# Patient Record
Sex: Male | Born: 1970
Health system: Southern US, Community
[De-identification: ages and names within clinical notes are randomized; demographics above are authoritative.]

## PROBLEM LIST (undated history)

## (undated) DIAGNOSIS — R358 Other polyuria: Secondary | ICD-10-CM

## (undated) DIAGNOSIS — R350 Frequency of micturition: Secondary | ICD-10-CM

## (undated) DIAGNOSIS — R519 Headache, unspecified: Secondary | ICD-10-CM

## (undated) DIAGNOSIS — F17200 Nicotine dependence, unspecified, uncomplicated: Secondary | ICD-10-CM

## (undated) DIAGNOSIS — R6882 Decreased libido: Secondary | ICD-10-CM

## (undated) DIAGNOSIS — E291 Testicular hypofunction: Secondary | ICD-10-CM

## (undated) DIAGNOSIS — I10 Essential (primary) hypertension: Secondary | ICD-10-CM

## (undated) DIAGNOSIS — R002 Palpitations: Secondary | ICD-10-CM

## (undated) HISTORY — DX: Other polyuria: R35.8

## (undated) HISTORY — DX: Frequency of micturition: R35.0

## (undated) HISTORY — DX: Testicular hypofunction: E29.1

## (undated) HISTORY — DX: Headache, unspecified: R51.9

## (undated) HISTORY — PX: FOOT SURGERY: SHX648

## (undated) HISTORY — DX: Decreased libido: R68.82

## (undated) HISTORY — DX: Nicotine dependence, unspecified, uncomplicated: F17.200

## (undated) HISTORY — DX: Palpitations: R00.2

---

## 2015-02-03 ENCOUNTER — Emergency Department (INDEPENDENT_AMBULATORY_CARE_PROVIDER_SITE_OTHER): Payer: Self-pay

## 2015-02-03 ENCOUNTER — Emergency Department (INDEPENDENT_AMBULATORY_CARE_PROVIDER_SITE_OTHER)
Admission: EM | Admit: 2015-02-03 | Discharge: 2015-02-03 | Disposition: A | Discharge status: Home / Self Care | Payer: Self-pay | Source: Home / Self Care | Attending: Family Medicine | Admitting: Family Medicine

## 2015-02-03 ENCOUNTER — Encounter (HOSPITAL_COMMUNITY): Payer: Self-pay | Admitting: Emergency Medicine

## 2015-02-03 DIAGNOSIS — S9032XA Contusion of left foot, initial encounter: Secondary | ICD-10-CM

## 2015-02-03 DIAGNOSIS — H6122 Impacted cerumen, left ear: Secondary | ICD-10-CM

## 2015-02-03 HISTORY — DX: Essential (primary) hypertension: I10

## 2015-02-03 NOTE — ED Notes (Signed)
Left ear and left foot pain, c/o in the last day

## 2015-02-03 NOTE — ED Provider Notes (Signed)
CSN: [id number]     Arrival date & time 02/03/15  1325 History   First MD Initiated Contact with Patient 02/03/15 1517     Chief Complaint  Patient presents with  . Otalgia   (Consider location/radiation/quality/duration/timing/severity/associated sxs/prior Treatment) HPI Comments: 44 year old male states that he struck the left foot, lateral edge on the door last night. He made contact at the base of the fifth metatarsal. This is where he is tender. He notes he had surgery on the left fifth digit from an infection approximately one year ago. He is ambulatory and bearing weight. His second complaint is decreased hearing in the left ear for approximately 8 months. No pain. States he has a history of wax buildup.   History reviewed. No pertinent past medical history. No past surgical history on file. No family history on file. Social History  Substance Use Topics  . Smoking status: None  . Smokeless tobacco: None  . Alcohol Use: None    Review of Systems  Constitutional: Negative for fever and activity change.  HENT: Negative.  Negative for ear discharge and ear pain.   Respiratory: Negative.   Gastrointestinal: Negative.   Musculoskeletal: Negative for myalgias and back pain.       As per history of present illness  Skin: Negative.   Neurological: Negative.     Allergies  Review of patient's allergies indicates no known allergies.  Home Medications   Prior to Admission medications   Not on File   Meds Ordered and Administered this Visit  Medications - No data to display  BP 151/96 mmHg  Pulse 74  Temp(Src) 98 F (36.7 C) (Oral)  Resp 16  SpO2 98% No data found.   Physical Exam  Constitutional: He is oriented to person, place, and time. He appears well-developed and well-nourished. No distress.  HENT:  Head: Normocephalic and atraumatic.  Left TM with impacted cerumen. Right TM is normal.  Neck: Normal range of motion. Neck supple.  Cardiovascular: Normal  rate.   Pulmonary/Chest: Effort normal. No respiratory distress.  Musculoskeletal: Normal range of motion. He exhibits tenderness. He exhibits no edema.  Left foot with no apparent swelling, deformity or discoloration. There is tenderness to the lateral edge of the distal forefoot over the fifth metatarsal. Skin is intact. Distal neurovascular motor sensory is intact.  Neurological: He is alert and oriented to person, place, and time. He exhibits normal muscle tone.  Skin: Skin is warm and dry.  Psychiatric: He has a normal mood and affect.  Nursing note and vitals reviewed.   ED Course  Procedures (including critical care time)  Labs Review Labs Reviewed - No data to display  Imaging Review Dg Foot Complete Left  02/03/2015   CLINICAL DATA:  44 year old who injured the left 5th toe by striking at on a door. Initial encounter. Prior history of partial resection of the 5th metatarsal due to infection, surgery performed in [place].  EXAM: LEFT FOOT - COMPLETE 3+ VIEW  COMPARISON:  None.  FINDINGS: No evidence of acute fracture or dislocation. Prior resection of the head of the 5th metatarsal. Minimal to mild narrowing of the 1st MCP joint space with dystrophic capsular calcification. Remaining joint spaces well preserved. Bone mineral density well-preserved. Very small plantar calcaneal spur. Phlebolith in the subcutaneous tissues anteriorly at the level of the ankle joint.  IMPRESSION: 1. No acute osseous abnormality. 2. Prior resection of the head of the 5th metatarsal. 3. 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  Electronically Signed   By: Hulan Saas M.D.   On: 02/03/2015 15:43     Visual Acuity Review  Right Eye Distance:   Left Eye Distance:   Bilateral Distance:    Right Eye Near:   Left Eye Near:    Bilateral Near:    Left ear irrigated with warm water til clear.     MDM   1. Contusion, foot, left, initial encounter   2. Cerumen impaction, left      Left EAC was irrigated and cleaned. The EAC is clear now. TM is mildly erythematous.. Tylenol or ibuprofen for pain,  ice off and on, limit weight bearing for 2-3 days.    Hayden Rasmussen, NP 02/03/15 1551  Hayden Rasmussen, NP 02/03/15 [phone]

## 2015-02-03 NOTE — Discharge Instructions (Signed)
Foot Contusion Ice off and on, limit weight bearing for 2-3 days. A foot contusion is a deep bruise to the foot. Contusions are the result of an injury that caused bleeding under the skin. The contusion may turn blue, purple, or yellow. Minor injuries will give you a painless contusion, but more severe contusions may stay painful and swollen for a few weeks. CAUSES  A foot contusion comes from a direct blow to that area, such as a heavy object falling on the foot. SYMPTOMS   Swelling of the foot.  Discoloration of the foot.  Tenderness or soreness of the foot. DIAGNOSIS  You will have a physical exam and will be asked about your history. You may need an X-ray of your foot to look for a broken bone (fracture).  TREATMENT  An elastic wrap may be recommended to support your foot. Resting, elevating, and applying cold compresses to your foot are often the best treatments for a foot contusion. Over-the-counter medicines may also be recommended for pain control. HOME CARE INSTRUCTIONS   Put ice on the injured area.  Put ice in a plastic bag.  Place a towel between your skin and the bag.  Leave the ice on for 15-20 minutes, 03-04 times a day.  Only take over-the-counter or prescription medicines for pain, discomfort, or fever as directed by your caregiver.  If told, use an elastic wrap as directed. This can help reduce swelling. You may remove the wrap for sleeping, showering, and bathing. If your toes become numb, cold, or blue, take the wrap off and reapply it more loosely.  Elevate your foot with pillows to reduce swelling.  Try to avoid standing or walking while the foot is painful. Do not resume use until instructed by your caregiver. Then, begin use gradually. If pain develops, decrease use. Gradually increase activities that do not cause discomfort until you have normal use of your foot.  See your caregiver as directed. It is very important to keep all follow-up appointments in  order to avoid any lasting problems with your foot, including long-term (chronic) pain. SEEK IMMEDIATE MEDICAL CARE IF:   You have increased redness, swelling, or pain in your foot.  Your swelling or pain is not relieved with medicines.  You have loss of feeling in your foot or are unable to move your toes.  Your foot turns cold or blue.  You have pain when you move your toes.  Your foot becomes warm to the touch.  Your contusion does not improve in 2 days. MAKE SURE YOU:   Understand these instructions.  Will watch your condition.  Will get help right away if you are not doing well or get worse. Document Released: 02/08/2006 Document Revised: 10/19/2011 Document Reviewed: 03/23/2011 [facility] Patient Information 2015 [place], [place]. This information is not intended to replace advice given to you by your health care provider. Make sure you discuss any questions you have with your health care provider.  Cerumen Impaction A cerumen impaction is when the wax in your ear forms a plug. This plug usually causes reduced hearing. Sometimes it also causes an earache or dizziness. Removing a cerumen impaction can be difficult and painful. The wax sticks to the ear canal. The canal is sensitive and bleeds easily. If you try to remove a heavy wax buildup with a cotton tipped swab, you may push it in further. Irrigation with water, suction, and small ear curettes may be used to clear out the wax. If the impaction is fixed  to the skin in the ear canal, ear drops may be needed for a few days to loosen the wax. People who build up a lot of wax frequently can use ear wax removal products available in your local drugstore. SEEK MEDICAL CARE IF:  You develop an earache, increased hearing loss, or marked dizziness. Document Released: 05/27/2004 Document Revised: 07/12/2011 Document Reviewed: 07/17/2009 [facility] Patient Information 2015 [place], [place]. This information is not intended to replace advice  given to you by your health care provider. Make sure you discuss any questions you have with your health care provider.

## 2015-02-17 ENCOUNTER — Other Ambulatory Visit (HOSPITAL_COMMUNITY): Payer: Self-pay | Admitting: Orthopaedic Surgery

## 2015-02-17 DIAGNOSIS — M79672 Pain in left foot: Secondary | ICD-10-CM

## 2015-02-25 ENCOUNTER — Ambulatory Visit (HOSPITAL_COMMUNITY)
Admission: RE | Admit: 2015-02-25 | Discharge: 2015-02-25 | Disposition: A | Discharge status: Home / Self Care | Payer: Self-pay | Source: Ambulatory Visit | Attending: Orthopaedic Surgery | Admitting: Orthopaedic Surgery

## 2015-02-25 ENCOUNTER — Other Ambulatory Visit (HOSPITAL_COMMUNITY): Payer: Self-pay | Admitting: Orthopaedic Surgery

## 2015-02-25 DIAGNOSIS — M79672 Pain in left foot: Secondary | ICD-10-CM

## 2015-02-25 DIAGNOSIS — Z8739 Personal history of other diseases of the musculoskeletal system and connective tissue: Secondary | ICD-10-CM | POA: Insufficient documentation

## 2015-02-25 DIAGNOSIS — M19072 Primary osteoarthritis, left ankle and foot: Secondary | ICD-10-CM | POA: Insufficient documentation

## 2015-02-25 LAB — POCT I-STAT CREATININE: Creatinine, Ser: 1.1 mg/dL (ref 0.61–1.24)

## 2015-02-25 MED ORDER — GADOBENATE DIMEGLUMINE 529 MG/ML IV SOLN
20.0000 mL | Freq: Once | INTRAVENOUS | Status: AC | PRN
  Administered 2015-02-25: 19 mL via INTRAVENOUS

## 2015-02-27 ENCOUNTER — Encounter: Payer: Self-pay | Admitting: Family Medicine

## 2015-02-27 ENCOUNTER — Ambulatory Visit (INDEPENDENT_AMBULATORY_CARE_PROVIDER_SITE_OTHER): Payer: Self-pay | Admitting: Family Medicine

## 2015-02-27 VITALS — BP 164/98 | HR 66 | Temp 98.6°F | Resp 16 | Ht 72.0 in | Wt 209.0 lb

## 2015-02-27 DIAGNOSIS — R358 Other polyuria: Secondary | ICD-10-CM

## 2015-02-27 DIAGNOSIS — Z0000 Encounter for general adult medical examination without abnormal findings: Secondary | ICD-10-CM

## 2015-02-27 DIAGNOSIS — I10 Essential (primary) hypertension: Secondary | ICD-10-CM

## 2015-02-27 DIAGNOSIS — R350 Frequency of micturition: Secondary | ICD-10-CM

## 2015-02-27 DIAGNOSIS — R3589 Other polyuria: Secondary | ICD-10-CM

## 2015-02-27 DIAGNOSIS — R5383 Other fatigue: Secondary | ICD-10-CM

## 2015-02-27 DIAGNOSIS — F17200 Nicotine dependence, unspecified, uncomplicated: Secondary | ICD-10-CM

## 2015-02-27 HISTORY — DX: Frequency of micturition: R35.0

## 2015-02-27 HISTORY — DX: Other polyuria: R35.89

## 2015-02-27 LAB — COMPLETE METABOLIC PANEL WITH GFR
ALT: 15 U/L (ref 9–46)
AST: 19 U/L (ref 10–40)
Albumin: 4.3 g/dL (ref 3.6–5.1)
Alkaline Phosphatase: 78 U/L (ref 40–115)
BUN: 9 mg/dL (ref 7–25)
CO2: 27 mmol/L (ref 20–31)
Calcium: 9.2 mg/dL (ref 8.6–10.3)
Chloride: 102 mmol/L (ref 98–110)
Creat: 0.93 mg/dL (ref 0.60–1.35)
GFR, Est African American: >89 mL/min (ref >=60)
GFR, Est Non African American: >89 mL/min (ref >=60)
Glucose, Bld: 86 mg/dL (ref 65–99)
Potassium: 4.3 mmol/L (ref 3.5–5.3)
Sodium: 137 mmol/L (ref 135–146)
Total Bilirubin: 0.9 mg/dL (ref 0.2–1.2)
Total Protein: 6.6 g/dL (ref 6.1–8.1)

## 2015-02-27 LAB — LIPID PANEL
CHOLESTEROL: 149 mg/dL (ref 125–200)
HDL: 75 mg/dL (ref >=40)
LDL CALC: 56 mg/dL (ref <130)
TRIGLYCERIDES: 89 mg/dL (ref <150)
Total CHOL/HDL Ratio: 2 Ratio (ref <=5.0)
VLDL: 18 mg/dL (ref <30)

## 2015-02-27 LAB — CBC WITH DIFFERENTIAL/PLATELET
Basophils Absolute: 0.1 10*3/uL (ref 0.0–0.1)
Basophils Relative: 1 % (ref 0–1)
Eosinophils Absolute: 0.1 10*3/uL (ref 0.0–0.7)
Eosinophils Relative: 1 % (ref 0–5)
HCT: 40.2 % (ref 39.0–52.0)
Hemoglobin: 13.6 g/dL (ref 13.0–17.0)
Lymphocytes Relative: 25 % (ref 12–46)
Lymphs Abs: 2 10*3/uL (ref 0.7–4.0)
MCH: 31.9 pg (ref 26.0–34.0)
MCHC: 33.8 g/dL (ref 30.0–36.0)
MCV: 94.4 fL (ref 78.0–100.0)
MPV: 10.4 fL (ref 8.6–12.4)
Monocytes Absolute: 0.4 10*3/uL (ref 0.1–1.0)
Monocytes Relative: 5 % (ref 3–12)
Neutro Abs: 5.3 10*3/uL (ref 1.7–7.7)
Neutrophils Relative %: 68 % (ref 43–77)
Platelets: 220 10*3/uL (ref 150–400)
RBC: 4.26 MIL/uL (ref 4.22–5.81)
RDW: 12.8 % (ref 11.5–15.5)
WBC: 7.8 10*3/uL (ref 4.0–10.5)

## 2015-02-27 LAB — POCT URINALYSIS DIP (DEVICE)
Bilirubin Urine: NEGATIVE
Glucose, UA: NEGATIVE mg/dL
HGB URINE DIPSTICK: NEGATIVE
KETONES UR: NEGATIVE mg/dL
Leukocytes, UA: NEGATIVE
NITRITE: NEGATIVE
PH: 7.5 (ref 5.0–8.0)
Protein, ur: NEGATIVE mg/dL
Specific Gravity, Urine: 1.02 (ref 1.005–1.030)
UROBILINOGEN UA: 0.2 mg/dL (ref 0.0–1.0)

## 2015-02-27 LAB — HEMOGLOBIN A1C
HEMOGLOBIN A1C: 5 % (ref <5.7)
MEAN PLASMA GLUCOSE: 97 mg/dL (ref <117)

## 2015-02-27 LAB — TSH: TSH: 1.048 u[IU]/mL (ref 0.350–4.500)

## 2015-02-27 MED ORDER — AMLODIPINE BESYLATE 5 MG PO TABS: 5.0000 mg | ORAL_TABLET | Freq: Every day | ORAL | Status: DC

## 2015-02-27 NOTE — Progress Notes (Signed)
Subjective:    Patient ID: Joshua Blake., male    DOB: June 05, 1970, 44 y.o.   MRN: [id number]  HPI Mr. Tamel Abel, a 44 year old male presents accompanied by wife to establish care. Mr. Cullipher states that he relocated from [place] 6 months ago and has not had a primary provider. He maintains that he has been using urgent care for all of his primary needs. He states that he has a history of hypertension and was on Lisinopril several years ago. He currently does not exercise or follow a low sodium diet. He is a heavy tobacco user, he smokes 1 pack per day. Patient denies chest pain, dyspnea, fatigue, lower extremity edema, orthopnea, syncope and tachypnea.  Cardiovascular risk factors include: smoking/ tobacco exposure. Past Medical History  Diagnosis Date  . Hypertension    Social History   Social History  . Marital Status: Single    Spouse Name: N/A  . Number of Children: N/A  . Years of Education: N/A   Occupational History  . Not on file.   Social History Main Topics  . Smoking status: Current Every Day Smoker -- 1.00 packs/day  . Smokeless tobacco: Not on file  . Alcohol Use: Yes     Comment: SOCIALLY   . Drug Use: No  . Sexual Activity: Not on file   Other Topics Concern  . Not on file   Social History Narrative   No Known Allergies   Review of Systems  Constitutional: Positive for fatigue.  HENT: Negative.   Eyes: Negative.   Cardiovascular: Negative.   Gastrointestinal: Negative.   Endocrine: Positive for polyuria. Negative for polydipsia and polyphagia.  Genitourinary: Positive for frequency. Negative for urgency and decreased urine volume.  Musculoskeletal: Negative.  Negative for myalgias.  Skin: Negative.   Allergic/Immunologic: Negative.   Neurological: Negative.   Hematological: Negative.   Psychiatric/Behavioral: Negative.  Negative for suicidal ideas and sleep disturbance.      Objective:   Physical Exam  Constitutional: He is  oriented to person, place, and time. He appears well-developed and well-nourished.  HENT:  Head: Normocephalic and atraumatic.  Right Ear: External ear normal.  Left Ear: External ear normal.  Mouth/Throat: Oropharynx is clear and moist. Abnormal dentition. Dental caries present.  Eyes: Conjunctivae and EOM are normal. Pupils are equal, round, and reactive to light.  Neck: Normal range of motion. Neck supple. No thyromegaly present.  Cardiovascular: Normal rate, regular rhythm, normal heart sounds and intact distal pulses.   Pulmonary/Chest: Effort normal and breath sounds normal.  Abdominal: Soft. Bowel sounds are normal. He exhibits no distension. There is no tenderness.  Musculoskeletal: Normal range of motion. He exhibits no edema or tenderness.  Neurological: He is alert and oriented to person, place, and time. He displays normal reflexes. He exhibits normal muscle tone.  Skin: Skin is warm and dry.  Psychiatric: He has a normal mood and affect. His behavior is normal. Judgment and thought content normal.      BP 164/98 mmHg  Pulse 66  Temp(Src) 98.6 F (37 C) (Oral)  Resp 16  Ht 6' (1.829 m)  Wt 209 lb (94.802 kg)  BMI 28.34 kg/m2  SpO2 100%  Assessment & Plan:   1. Essential hypertension Blood pressure is currently above target. Will start amlodipine 5 mg daily. Discussed potential side effects at length. Reviewed urinalysis, no proteinuria present.  - POCT urinalysis dipstick - amLODipine (NORVASC) 5 MG tablet; Take 1 tablet (5 mg total)  by mouth daily.  Dispense: 30 tablet; Refill: 0 - Lipid Panel  2. Frequency of urination and polyuria - Hemoglobin A1C - amLODipine (NORVASC) 5 MG tablet; Take 1 tablet (5 mg total) by mouth daily.  Dispense: 30 tablet; Refill: 0  3. Other fatigue - COMPLETE METABOLIC PANEL WITH GFR - CBC with Differential - TSH  4. Tobacco dependence Smoking cessation instruction/counseling given:  counseled patient on the dangers of tobacco use,  advised patient to stop smoking, and reviewed strategies to maximize success  5. Routine health maintenance Will schedule CPE with digital rectal examination Recommend yearly eye examination Recommend dental visit for multiple caries Recommend a lowfat, low carbohydrate diet divided over 5-6 small meals, increase water intake to 6-8 glasses, and 150 minutes per week of cardiovascular exercise.   RTC: 1 month for hypertension Marda Breidenbach M, FNP    The patient was given clear instructions to go to ER or return to medical center if symptoms do not improve, worsen or new problems develop. The patient verbalized understanding. Will notify patient with laboratory results.

## 2015-02-27 NOTE — Patient Instructions (Addendum)
Will start Amlodipine 5 mg daily.il follow up in 1 month.  Recommend a lowfat, low carbohydrate diet divided over 5-6 small meals, increase water intake to 6-8 glasses, and 150 minutes per week of cardiovascular exercise.  Hypertension Hypertension, commonly called high blood pressure, is when the force of blood pumping through your arteries is too strong. Your arteries are the blood vessels that carry blood from your heart throughout your body. A blood pressure reading consists of a higher number over a lower number, such as 110/72. The higher number (systolic) is the pressure inside your arteries when your heart pumps. The lower number (diastolic) is the pressure inside your arteries when your heart relaxes. Ideally you want your blood pressure below 120/80. Hypertension forces your heart to work harder to pump blood. Your arteries may become narrow or stiff. Having untreated or uncontrolled hypertension can cause heart attack, stroke, kidney disease, and other problems. RISK FACTORS Some risk factors for high blood pressure are controllable. Others are not.  Risk factors you cannot control include:   Race. You may be at higher risk if you are African American.  Age. Risk increases with age.  Gender. Men are at higher risk than women before age 43 years. After age 43, women are at higher risk than men. Risk factors you can control include:  Not getting enough exercise or physical activity.  Being overweight.  Getting too much fat, sugar, calories, or salt in your diet.  Drinking too much alcohol. SIGNS AND SYMPTOMS Hypertension does not usually cause signs or symptoms. Extremely high blood pressure (hypertensive crisis) may cause headache, anxiety, shortness of breath, and nosebleed. DIAGNOSIS To check if you have hypertension, your health care provider will measure your blood pressure while you are seated, with your arm held at the level of your heart. It should be measured at least  twice using the same arm. Certain conditions can cause a difference in blood pressure between your right and left arms. A blood pressure reading that is higher than normal on one occasion does not mean that you need treatment. If it is not clear whether you have high blood pressure, you may be asked to return on a different day to have your blood pressure checked again. Or, you may be asked to monitor your blood pressure at home for 1 or more weeks. TREATMENT Treating high blood pressure includes making lifestyle changes and possibly taking medicine. Living a healthy lifestyle can help lower high blood pressure. You may need to change some of your habits. Lifestyle changes may include:  Following the DASH diet. This diet is high in fruits, vegetables, and whole grains. It is low in salt, red meat, and added sugars.  Keep your sodium intake below 2,300 mg per day.  Getting at least 30-45 minutes of aerobic exercise at least 4 times per week.  Losing weight if necessary.  Not smoking.  Limiting alcoholic beverages.  Learning ways to reduce stress. Your health care provider may prescribe medicine if lifestyle changes are not enough to get your blood pressure under control, and if one of the following is true:  You are 5218-43 years of age and your systolic blood pressure is above 140.  You are 43 years of age or older, and your systolic blood pressure is above 150.  Your diastolic blood pressure is above 90.  You have diabetes, and your systolic blood pressure is over 140 or your diastolic blood pressure is over 90.  You have kidney disease and  your blood pressure is above 140/90.  You have heart disease and your blood pressure is above 140/90. Your personal target blood pressure may vary depending on your medical conditions, your age, and other factors. HOME CARE INSTRUCTIONS  Have your blood pressure rechecked as directed by your health care provider.   Take medicines only as  directed by your health care provider. Follow the directions carefully. Blood pressure medicines must be taken as prescribed. The medicine does not work as well when you skip doses. Skipping doses also puts you at risk for problems.  Do not smoke.   Monitor your blood pressure at home as directed by your health care provider. SEEK MEDICAL CARE IF:   You think you are having a reaction to medicines taken.  You have recurrent headaches or feel dizzy.  You have swelling in your ankles.  You have trouble with your vision. SEEK IMMEDIATE MEDICAL CARE IF:  You develop a severe headache or confusion.  You have unusual weakness, numbness, or feel faint.  You have severe chest or abdominal pain.  You vomit repeatedly.  You have trouble breathing. MAKE SURE YOU:   Understand these instructions.  Will watch your condition.  Will get help right away if you are not doing well or get worse.   This information is not intended to replace advice given to you by your health care provider. Make sure you discuss any questions you have with your health care provider.   Document Released: 04/19/2005 Document Revised: 09/03/2014 Document Reviewed: 02/09/2013 Elsevier Interactive Patient Education 2016 [employer] DASH Eating Plan DASH stands for "Dietary Approaches to Stop Hypertension." The DASH eating plan is a healthy eating plan that has been shown to reduce high blood pressure (hypertension). Additional health benefits may include reducing the risk of type 2 diabetes mellitus, heart disease, and stroke. The DASH eating plan may also help with weight loss. WHAT DO I NEED TO KNOW ABOUT THE DASH EATING PLAN? For the DASH eating plan, you will follow these general guidelines:  Choose foods with a percent daily value for sodium of less than 5% (as listed on the food label).  Use salt-free seasonings or herbs instead of table salt or sea salt.  Check with your health care provider or  pharmacist before using salt substitutes.  Eat lower-sodium products, often labeled as "lower sodium" or "no salt added."  Eat fresh foods.  Eat more vegetables, fruits, and low-fat dairy products.  Choose whole grains. Look for the word "whole" as the first word in the ingredient list.  Choose fish and skinless chicken or [place] more often than red meat. Limit fish, poultry, and meat to 6 oz (170 g) each day.  Limit sweets, desserts, sugars, and sugary drinks.  Choose heart-healthy fats.  Limit cheese to 1 oz (28 g) per day.  Eat more home-cooked food and less restaurant, buffet, and fast food.  Limit fried foods.  Cook foods using methods other than frying.  Limit canned vegetables. If you do use them, rinse them well to decrease the sodium.  When eating at a restaurant, ask that your food be prepared with less salt, or no salt if possible. WHAT FOODS CAN I EAT? Seek help from a dietitian for individual calorie needs. Grains Whole grain or whole wheat bread. Brown rice. Whole grain or whole wheat pasta. Quinoa, bulgur, and whole grain cereals. Low-sodium cereals. Corn or whole wheat flour tortillas. Whole grain cornbread. Whole grain crackers. Low-sodium crackers. Vegetables Fresh or frozen  vegetables (raw, steamed, roasted, or grilled). Low-sodium or reduced-sodium tomato and vegetable juices. Low-sodium or reduced-sodium tomato sauce and paste. Low-sodium or reduced-sodium canned vegetables.  Fruits All fresh, canned (in natural juice), or frozen fruits. Meat and Other Protein Products Ground beef (85% or leaner), grass-fed beef, or beef trimmed of fat. Skinless chicken or [place]. Ground chicken or [place]. Pork trimmed of fat. All fish and seafood. Eggs. Dried beans, peas, or lentils. Unsalted nuts and seeds. Unsalted canned beans. Dairy Low-fat dairy products, such as skim or 1% milk, 2% or reduced-fat cheeses, low-fat ricotta or cottage cheese, or plain low-fat yogurt.  Low-sodium or reduced-sodium cheeses. Fats and Oils Tub margarines without trans fats. Light or reduced-fat mayonnaise and salad dressings (reduced sodium). Avocado. Safflower, olive, or canola oils. Natural peanut or almond butter. Other Unsalted popcorn and pretzels. The items listed above may not be a complete list of recommended foods or beverages. Contact your dietitian for more options. WHAT FOODS ARE NOT RECOMMENDED? Grains White bread. White pasta. White rice. Refined cornbread. Bagels and croissants. Crackers that contain trans fat. Vegetables Creamed or fried vegetables. Vegetables in a cheese sauce. Regular canned vegetables. Regular canned tomato sauce and paste. Regular tomato and vegetable juices. Fruits Dried fruits. Canned fruit in light or heavy syrup. Fruit juice. Meat and Other Protein Products Fatty cuts of meat. Ribs, chicken wings, bacon, sausage, bologna, salami, chitterlings, fatback, hot dogs, bratwurst, and packaged luncheon meats. Salted nuts and seeds. Canned beans with salt. Dairy Whole or 2% milk, cream, half-and-half, and cream cheese. Whole-fat or sweetened yogurt. Full-fat cheeses or blue cheese. Nondairy creamers and whipped toppings. Processed cheese, cheese spreads, or cheese curds. Condiments Onion and garlic salt, seasoned salt, table salt, and sea salt. Canned and packaged gravies. Worcestershire sauce. Tartar sauce. Barbecue sauce. Teriyaki sauce. Soy sauce, including reduced sodium. Steak sauce. Fish sauce. Oyster sauce. Cocktail sauce. Horseradish. Ketchup and mustard. Meat flavorings and tenderizers. Bouillon cubes. Hot sauce. Tabasco sauce. Marinades. Taco seasonings. Relishes. Fats and Oils Butter, stick margarine, lard, shortening, ghee, and bacon fat. Coconut, palm kernel, or palm oils. Regular salad dressings. Other Pickles and olives. Salted popcorn and pretzels. The items listed above may not be a complete list of foods and beverages to avoid.  Contact your dietitian for more information. WHERE CAN I FIND MORE INFORMATION? National Heart, Lung, and Blood Institute: [employer]   This information is not intended to replace advice given to you by your health care provider. Make sure you discuss any questions you have with your health care provider.   Document Released: 04/08/2011 Document Revised: 05/10/2014 Document Reviewed: 02/21/2013 Elsevier Interactive Patient Education [employer]

## 2015-03-19 ENCOUNTER — Telehealth: Payer: Self-pay | Admitting: Family Medicine

## 2015-03-19 NOTE — Telephone Encounter (Signed)
Left message for patient to call regarding rescheduled appointment for 04/01/15.

## 2015-04-01 ENCOUNTER — Ambulatory Visit: Payer: Self-pay | Admitting: Family Medicine

## 2015-04-03 ENCOUNTER — Encounter: Payer: Self-pay | Admitting: Family Medicine

## 2015-04-03 ENCOUNTER — Ambulatory Visit (INDEPENDENT_AMBULATORY_CARE_PROVIDER_SITE_OTHER): Payer: Self-pay | Admitting: Family Medicine

## 2015-04-03 VITALS — BP 142/96 | HR 65 | Temp 98.2°F | Resp 14 | Ht 72.0 in | Wt 213.0 lb

## 2015-04-03 DIAGNOSIS — G4730 Sleep apnea, unspecified: Secondary | ICD-10-CM

## 2015-04-03 DIAGNOSIS — Z87898 Personal history of other specified conditions: Secondary | ICD-10-CM

## 2015-04-03 DIAGNOSIS — R350 Frequency of micturition: Secondary | ICD-10-CM

## 2015-04-03 DIAGNOSIS — I10 Essential (primary) hypertension: Secondary | ICD-10-CM

## 2015-04-03 DIAGNOSIS — G4710 Hypersomnia, unspecified: Secondary | ICD-10-CM

## 2015-04-03 DIAGNOSIS — R358 Other polyuria: Secondary | ICD-10-CM

## 2015-04-03 DIAGNOSIS — R3589 Other polyuria: Secondary | ICD-10-CM

## 2015-04-03 DIAGNOSIS — F17200 Nicotine dependence, unspecified, uncomplicated: Secondary | ICD-10-CM

## 2015-04-03 DIAGNOSIS — Z8489 Family history of other specified conditions: Secondary | ICD-10-CM

## 2015-04-03 HISTORY — DX: Nicotine dependence, unspecified, uncomplicated: F17.200

## 2015-04-03 LAB — POCT URINALYSIS DIP (DEVICE)
BILIRUBIN URINE: NEGATIVE
Glucose, UA: NEGATIVE mg/dL
HGB URINE DIPSTICK: NEGATIVE
KETONES UR: NEGATIVE mg/dL
Leukocytes, UA: NEGATIVE
Nitrite: NEGATIVE
PH: 5.5 (ref 5.0–8.0)
PROTEIN: NEGATIVE mg/dL
Urobilinogen, UA: 0.2 mg/dL (ref 0.0–1.0)

## 2015-04-03 MED ORDER — AMLODIPINE BESYLATE 5 MG PO TABS: 5.0000 mg | ORAL_TABLET | Freq: Every day | ORAL | Status: DC

## 2015-04-03 NOTE — Progress Notes (Signed)
Subjective:    Patient ID: Joshua Blake., male    DOB: 03-31-1971, 44 y.o.   MRN: [id number]  Hypertension   Mr. Joshua Blake, a 44 year old male presents accompanied by wife for a 1 month follow-up.He currently does not exercise or follow a low sodium diet. He is a heavy tobacco user, he smokes 1 pack per day. Patient denies chest pain, dyspnea, fatigue, lower extremity edema, orthopnea, syncope and tachypnea.  Cardiovascular risk factors include: smoking/ tobacco exposure. Patient is complaining of increased fatigue. He states that he gets 5-6 hours of sleep per night. He works a non traditional schedule and typically gets home at 2 am. His wife states that he snores heavily and has periods of not breathing. Patient states that excessive daytime sleepiness has been occuring over the past month.    Past Medical History  Diagnosis Date  . Hypertension    Social History   Social History  . Marital Status: Single    Spouse Name: N/A  . Number of Children: N/A  . Years of Education: N/A   Occupational History  . Not on file.   Social History Main Topics  . Smoking status: Current Every Day Smoker -- 1.00 packs/day  . Smokeless tobacco: Not on file  . Alcohol Use: Yes     Comment: SOCIALLY   . Drug Use: No  . Sexual Activity: Not on file   Other Topics Concern  . Not on file   Social History Narrative   No Known Allergies   Review of Systems  Constitutional: Positive for fatigue (excessive daytime sleepiness).  HENT: Negative.   Eyes: Negative.  Negative for visual disturbance.  Respiratory: Positive for apnea (observed sleep apnea).   Cardiovascular: Negative.   Gastrointestinal: Negative.   Endocrine: Negative for polydipsia, polyphagia and polyuria.  Genitourinary: Negative for urgency and decreased urine volume.  Musculoskeletal: Negative.  Negative for myalgias.  Skin: Negative.   Allergic/Immunologic: Negative.   Neurological: Negative.  Negative  for dizziness and facial asymmetry.  Hematological: Negative.   Psychiatric/Behavioral: Negative.  Negative for suicidal ideas and sleep disturbance.      Objective:   Physical Exam  Constitutional: He is oriented to person, place, and time. He appears well-developed and well-nourished.  HENT:  Head: Normocephalic and atraumatic.  Right Ear: External ear normal.  Left Ear: External ear normal.  Mouth/Throat: Oropharynx is clear and moist.  Eyes: Conjunctivae and EOM are normal. Pupils are equal, round, and reactive to light.  Neck: Normal range of motion. Neck supple. No thyromegaly present.  Cardiovascular: Normal rate, regular rhythm, normal heart sounds and intact distal pulses.   Pulmonary/Chest: Effort normal and breath sounds normal.  Abdominal: Soft. Bowel sounds are normal. He exhibits no distension. There is no tenderness.  Musculoskeletal: Normal range of motion. He exhibits no edema or tenderness.  Neurological: He is alert and oriented to person, place, and time. He displays normal reflexes. He exhibits normal muscle tone.  Skin: Skin is warm and dry.  Psychiatric: He has a normal mood and affect. His behavior is normal. Judgment and thought content normal.      BP 142/96 mmHg  Pulse 65  Temp(Src) 98.2 F (36.8 C) (Oral)  Resp 14  Ht 6' (1.829 Blake)  Wt 213 lb (96.616 kg)  BMI 28.88 kg/m2  Assessment & Plan:   1. Essential hypertension Blood pressure is at goal on current medication regimen. The patient is asked to make an attempt to  improve diet and exercise patterns to aid in medical management of this problem. Will continue amlodipine. Reviewed urinalysis, no proteinuria present.  - POCT urinalysis dipstick  2. Excessive sleepiness Patient typically gets 5-6 hours of sleep during the night, he awakens once to urinate. He reports that he does not have difficulty falling asleep or staying asleep.  - Split night study; Future  3. Observed sleep apnea  - Split  night study; Future  4. History of snoring  - Split night study; Future  5. Tobacco dependence Smoking cessation instruction/counseling given:  counseled patient on the dangers of tobacco use, advised patient to stop smoking, and reviewed strategies to maximize success     RTC: 3 months for hypertension   Massie MaroonHollis,Joshua Ciresi M, FNP

## 2015-04-03 NOTE — Patient Instructions (Addendum)
Hypertension Hypertension, commonly called high blood pressure, is when the force of blood pumping through your arteries is too strong. Your arteries are the blood vessels that carry blood from your heart throughout your body. A blood pressure reading consists of a higher number over a lower number, such as 110/72. The higher number (systolic) is the pressure inside your arteries when your heart pumps. The lower number (diastolic) is the pressure inside your arteries when your heart relaxes. Ideally you want your blood pressure below 120/80. Hypertension forces your heart to work harder to pump blood. Your arteries may become narrow or stiff. Having untreated or uncontrolled hypertension can cause heart attack, stroke, kidney disease, and other problems. RISK FACTORS Some risk factors for high blood pressure are controllable. Others are not.  Risk factors you cannot control include:   Race. You may be at higher risk if you are African American.  Age. Risk increases with age.  Gender. Men are at higher risk than women before age 88 years. After age 105, women are at higher risk than men. Risk factors you can control include: 1. Not getting enough exercise or physical activity. 2. Being overweight. 3. Getting too much fat, sugar, calories, or salt in your diet. 4. Drinking too much alcohol. SIGNS AND SYMPTOMS Hypertension does not usually cause signs or symptoms. Extremely high blood pressure (hypertensive crisis) may cause headache, anxiety, shortness of breath, and nosebleed. DIAGNOSIS To check if you have hypertension, your health care provider will measure your blood pressure while you are seated, with your arm held at the level of your heart. It should be measured at least twice using the same arm. Certain conditions can cause a difference in blood pressure between your right and left arms. A blood pressure reading that is higher than normal on one occasion does not mean that you need  treatment. If it is not clear whether you have high blood pressure, you may be asked to return on a different day to have your blood pressure checked again. Or, you may be asked to monitor your blood pressure at home for 1 or more weeks. TREATMENT Treating high blood pressure includes making lifestyle changes and possibly taking medicine. Living a healthy lifestyle can help lower high blood pressure. You may need to change some of your habits. Lifestyle changes may include:  Following the DASH diet. This diet is high in fruits, vegetables, and whole grains. It is low in salt, red meat, and added sugars.  Keep your sodium intake below 2,300 mg per day.  Getting at least 30-45 minutes of aerobic exercise at least 4 times per week.  Losing weight if necessary.  Not smoking.  Limiting alcoholic beverages.  Learning ways to reduce stress. Your health care provider may prescribe medicine if lifestyle changes are not enough to get your blood pressure under control, and if one of the following is true:  You are 41-58 years of age and your systolic blood pressure is above 140.  You are 46 years of age or older, and your systolic blood pressure is above 150.  Your diastolic blood pressure is above 90.  You have diabetes, and your systolic blood pressure is over [id number] or your diastolic blood pressure is over 90.  You have kidney disease and your blood pressure is above 140/90.  You have heart disease and your blood pressure is above 140/90. Your personal target blood pressure may vary depending on your medical conditions, your age, and other factors. HOME CARE INSTRUCTIONS  Have your blood pressure rechecked as directed by your health care provider.   Take medicines only as directed by your health care provider. Follow the directions carefully. Blood pressure medicines must be taken as prescribed. The medicine does not work as well when you skip doses. Skipping doses also puts you at risk for  problems.  Do not smoke.   Monitor your blood pressure at home as directed by your health care provider. SEEK MEDICAL CARE IF:   You think you are having a reaction to medicines taken.  You have recurrent headaches or feel dizzy.  You have swelling in your ankles.  You have trouble with your vision. SEEK IMMEDIATE MEDICAL CARE IF:  You develop a severe headache or confusion.  You have unusual weakness, numbness, or feel faint.  You have severe chest or abdominal pain.  You vomit repeatedly.  You have trouble breathing. MAKE SURE YOU:   Understand these instructions.  Will watch your condition.  Will get help right away if you are not doing well or get worse.   This information is not intended to replace advice given to you by your health care provider. Make sure you discuss any questions you have with your health care provider.   Document Released: 04/19/2005 Document Revised: 09/03/2014 Document Reviewed: 02/09/2013 Elsevier Interactive Patient Education 2016 Elsevier Inc. Sleep Apnea  Sleep apnea is a sleep disorder characterized by abnormal pauses in breathing while you sleep. When your breathing pauses, the level of oxygen in your blood decreases. This causes you to move out of deep sleep and into light sleep. As a result, your quality of sleep is poor, and the system that carries your blood throughout your body (cardiovascular system) experiences stress. If sleep apnea remains untreated, the following conditions can develop:  High blood pressure (hypertension).  Coronary artery disease.  Inability to achieve or maintain an erection (impotence).  Impairment of your thought process (cognitive dysfunction). There are three types of sleep apnea: 5. Obstructive sleep apnea--Pauses in breathing during sleep because of a blocked airway. 6. Central sleep apnea--Pauses in breathing during sleep because the area of the brain that controls your breathing does not send  the correct signals to the muscles that control breathing. 7. Mixed sleep apnea--A combination of both obstructive and central sleep apnea. RISK FACTORS The following risk factors can increase your risk of developing sleep apnea:  Being overweight.  Smoking.  Having narrow passages in your nose and throat.  Being of older age.  Being male.  Alcohol use.  Sedative and tranquilizer use.  Ethnicity. Among individuals younger than 35 years, African Americans are at increased risk of sleep apnea. SYMPTOMS   Difficulty staying asleep.  Daytime sleepiness and fatigue.  Loss of energy.  Irritability.  Loud, heavy snoring.  Morning headaches.  Trouble concentrating.  Forgetfulness.  Decreased interest in sex.  Unexplained sleepiness. DIAGNOSIS  In order to diagnose sleep apnea, your caregiver will perform a physical examination. A sleep study done in the comfort of your own home may be appropriate if you are otherwise healthy. Your caregiver may also recommend that you spend the night in a sleep lab. In the sleep lab, several monitors record information about your heart, lungs, and brain while you sleep. Your leg and arm movements and blood oxygen level are also recorded. TREATMENT The following actions may help to resolve mild sleep apnea:  Sleeping on your side.   Using a decongestant if you have nasal congestion.   Avoiding the  use of depressants, including alcohol, sedatives, and narcotics.   Losing weight and modifying your diet if you are overweight. There also are devices and treatments to help open your airway:  Oral appliances. These are custom-made mouthpieces that shift your lower jaw forward and slightly open your bite. This opens your airway.  Devices that create positive airway pressure. This positive pressure "splints" your airway open to help you breathe better during sleep. The following devices create positive airway pressure:  Continuous  positive airway pressure (CPAP) device. The CPAP device creates a continuous level of air pressure with an air pump. The air is delivered to your airway through a mask while you sleep. This continuous pressure keeps your airway open.  Nasal expiratory positive airway pressure (EPAP) device. The EPAP device creates positive air pressure as you exhale. The device consists of single-use valves, which are inserted into each nostril and held in place by adhesive. The valves create very little resistance when you inhale but create much more resistance when you exhale. That increased resistance creates the positive airway pressure. This positive pressure while you exhale keeps your airway open, making it easier to breath when you inhale again.  Bilevel positive airway pressure (BPAP) device. The BPAP device is used mainly in patients with central sleep apnea. This device is similar to the CPAP device because it also uses an air pump to deliver continuous air pressure through a mask. However, with the BPAP machine, the pressure is set at two different levels. The pressure when you exhale is lower than the pressure when you inhale.  Surgery. Typically, surgery is only done if you cannot comply with less invasive treatments or if the less invasive treatments do not improve your condition. Surgery involves removing excess tissue in your airway to create a wider passage way.   This information is not intended to replace advice given to you by your health care provider. Make sure you discuss any questions you have with your health care provider.   Document Released: 04/09/2002 Document Revised: 05/10/2014 Document Reviewed: 08/26/2011 Elsevier Interactive Patient Education [employer]2016 Elsevier Inc.

## 2015-05-19 ENCOUNTER — Emergency Department (INDEPENDENT_AMBULATORY_CARE_PROVIDER_SITE_OTHER)
Admission: EM | Admit: 2015-05-19 | Discharge: 2015-05-19 | Disposition: A | Discharge status: Home / Self Care | Payer: Self-pay | Source: Home / Self Care | Attending: Family Medicine | Admitting: Family Medicine

## 2015-05-19 ENCOUNTER — Encounter (HOSPITAL_BASED_OUTPATIENT_CLINIC_OR_DEPARTMENT_OTHER): Payer: Self-pay

## 2015-05-19 ENCOUNTER — Encounter (HOSPITAL_COMMUNITY): Payer: Self-pay | Admitting: Emergency Medicine

## 2015-05-19 DIAGNOSIS — G4482 Headache associated with sexual activity: Secondary | ICD-10-CM

## 2015-05-19 MED ORDER — KETOROLAC TROMETHAMINE 60 MG/2ML IM SOLN
INTRAMUSCULAR | Status: AC
  Filled 2015-05-19: qty 2

## 2015-05-19 MED ORDER — KETOROLAC TROMETHAMINE 60 MG/2ML IM SOLN
60.0000 mg | Freq: Once | INTRAMUSCULAR | Status: AC
  Administered 2015-05-19: 60 mg via INTRAMUSCULAR

## 2015-05-19 NOTE — ED Notes (Signed)
Here with frontal migraine headache after taking otc sexual enhancement drug 2 days ago Denies blurred vision or dizziness BP- 147/95

## 2015-05-19 NOTE — Discharge Instructions (Signed)

## 2015-05-19 NOTE — ED Provider Notes (Signed)
CSN: [id number]     Arrival date & time 05/19/15  1322 History   First MD Initiated Contact with Patient 05/19/15 1503     Chief Complaint  Patient presents with  . Migraine   (Consider location/radiation/quality/duration/timing/severity/associated sxs/prior Treatment) HPI Patient states he has had a headache for 2 days after taking a sexual enhancement medication. He states that one of the side effects of the medication is headaches. Takes his headache is been excruciating 6 out of 10. Vision disturbances. thunderclap headache. He states that his doctor had prescribed an antihypertensive for him in the last several months his sexual performance has been lacking somewhat. And so he took this enhancement medication. Denies any seizures, tremors no loss of consciousness. Past Medical History  Diagnosis Date  . Hypertension    Past Surgical History  Procedure Laterality Date  . Foot surgery     Family History  Problem Relation Age of Onset  . Hypertension Mother   . Hypertension Father   . Diabetes Maternal Grandmother    Social History  Substance Use Topics  . Smoking status: Current Every Day Smoker -- 1.00 packs/day  . Smokeless tobacco: None  . Alcohol Use: Yes     Comment: SOCIALLY     Review of Systems ROS +'ve headache  Denies:   NAUSEA, ABDOMINAL PAIN, CHEST PAIN, CONGESTION, DYSURIA, SHORTNESS OF BREATH  Allergies  Review of patient's allergies indicates no known allergies.  Home Medications   Prior to Admission medications   Medication Sig Start Date End Date Taking? Authorizing Provider  amLODipine (NORVASC) 5 MG tablet Take 1 tablet (5 mg total) by mouth daily. 04/03/15   Massie MaroonLachina M Hollis, FNP   Meds Ordered and Administered this Visit   Medications  ketorolac (TORADOL) injection 60 mg (60 mg Intramuscular Given 05/19/15 1515)    BP 147/95 mmHg  Pulse 60  Temp(Src) 98.1 F (36.7 C) (Oral)  Resp 16  SpO2 100% No data found.   Physical Exam   Constitutional: He is oriented to person, place, and time. He appears well-developed and well-nourished.  HENT:  Head: Normocephalic and atraumatic.  Right Ear: External ear normal.  Left Ear: External ear normal.  Mouth/Throat: Oropharynx is clear and moist.  Eyes: Conjunctivae are normal.  Neck: Normal range of motion. Neck supple.  Cardiovascular: Normal rate.   Pulmonary/Chest: Effort normal.  Musculoskeletal: Normal range of motion.  Neurological: He is alert and oriented to person, place, and time.  Skin: Skin is warm and dry.  Psychiatric: He has a normal mood and affect. His behavior is normal. Judgment and thought content normal.  Nursing note and vitals reviewed.   ED Course  Procedures (including critical care time)  Labs Review Labs Reviewed - No data to display  Imaging Review No results found.   Visual Acuity Review  Right Eye Distance:   Left Eye Distance:   Bilateral Distance:    Right Eye Near:   Left Eye Near:    Bilateral Near:       Pt states that he is feeling much better after toradol injection.   MDM   1. Headache associated with sexual activity     Symptomatic treatment, OTC headache medications.  Return if there are new or worsening of symptoms.     Tharon AquasFrank C Cami Delawder, PA 05/19/15 1640

## 2015-06-11 MED FILL — AMLODIPINE BESYLATE 5 MG TA: 5 | 30 days supply | Qty: 30 | Fill #1

## 2015-06-15 ENCOUNTER — Ambulatory Visit (HOSPITAL_BASED_OUTPATIENT_CLINIC_OR_DEPARTMENT_OTHER): Payer: Self-pay | Attending: Family Medicine

## 2015-07-04 ENCOUNTER — Ambulatory Visit: Payer: Self-pay | Admitting: Family Medicine

## 2015-09-21 ENCOUNTER — Ambulatory Visit (HOSPITAL_BASED_OUTPATIENT_CLINIC_OR_DEPARTMENT_OTHER): Payer: Self-pay | Attending: Family Medicine | Admitting: Internal Medicine

## 2015-09-21 VITALS — Ht 72.0 in | Wt 220.0 lb

## 2015-09-21 DIAGNOSIS — G4730 Sleep apnea, unspecified: Secondary | ICD-10-CM

## 2015-09-21 DIAGNOSIS — Z87898 Personal history of other specified conditions: Secondary | ICD-10-CM

## 2015-09-21 DIAGNOSIS — G4733 Obstructive sleep apnea (adult) (pediatric): Secondary | ICD-10-CM | POA: Insufficient documentation

## 2015-09-21 DIAGNOSIS — G4710 Hypersomnia, unspecified: Secondary | ICD-10-CM

## 2015-09-28 DIAGNOSIS — G4733 Obstructive sleep apnea (adult) (pediatric): Secondary | ICD-10-CM

## 2015-09-28 DIAGNOSIS — Z8489 Family history of other specified conditions: Secondary | ICD-10-CM

## 2015-09-28 DIAGNOSIS — G4710 Hypersomnia, unspecified: Secondary | ICD-10-CM

## 2015-09-28 DIAGNOSIS — G4730 Sleep apnea, unspecified: Secondary | ICD-10-CM

## 2015-09-28 NOTE — Procedures (Signed)
  Patient Name: Moody BruinsRhynes, Yoan Joe Study Date: 09/21/2015 Gender: Male D.O.B: [birth year] Age (years): 44 Referring Provider: Julianne HandlerLachina Hollis Height (inches): 72 Interpreting Physician: Jetty Duhamellinton Haskell Rihn MD, ABSM Weight (lbs): 220 RPSGT: Melburn PopperWillard, Susan BMI: 30 MRN: [id number] Neck Size: 17.00 CLINICAL INFORMATION Sleep Study Type: NPSG Indication for sleep study: Excessive Daytime Sleepiness, OSA, Snoring Epworth Sleepiness Score: 8  SLEEP STUDY TECHNIQUE As per the AASM Manual for the Scoring of Sleep and Associated Events v2.3 (April 2016) with a hypopnea requiring 4% desaturations. The channels recorded and monitored were frontal, central and occipital EEG, electrooculogram (EOG), submentalis EMG (chin), nasal and oral airflow, thoracic and abdominal wall motion, anterior tibialis EMG, snore microphone, electrocardiogram, and pulse oximetry.  MEDICATIONS Patient's medications include: charted for review. Medications self-administered by patient during sleep study : No sleep medicine administered.  SLEEP ARCHITECTURE The study was initiated at 10:37:19 PM and ended at 4:39:59 AM. Sleep onset time was 27.3 minutes and the sleep efficiency was 77.6%. The total sleep time was 281.5 minutes. Stage REM latency was 13.0 minutes. The patient spent 4.44% of the night in stage N1 sleep, 83.30% in stage N2 sleep, 0.00% in stage N3 and 12.26% in REM. Alpha intrusion was absent. Supine sleep was 26.29%.  RESPIRATORY PARAMETERS The overall apnea/hypopnea index (AHI) was 5.8 per hour. There were 12 total apneas, including 8 obstructive, 4 central and 0 mixed apneas. There were 15 hypopneas and 20 RERAs. The AHI during Stage REM sleep was 10.4 per hour. AHI while supine was 5.7 per hour. The mean oxygen saturation was 94.58%. The minimum SpO2 during sleep was 87.00%. Moderate snoring was noted during this study.  CARDIAC DATA The 2 lead EKG demonstrated sinus rhythm. The mean heart rate was  75.53 beats per minute. Other EKG findings include: None.  LEG MOVEMENT DATA The total PLMS were 13 with a resulting PLMS index of 2.77. Associated arousal with leg movement index was 0.2 .  IMPRESSIONS - Mild obstructive sleep apnea occurred during this study (AHI = 5.8/h). - There were not enough events to qualify for split protocol CPAP titration. - No significant central sleep apnea occurred during this study (CAI = 0.9/h). - Mild oxygen desaturation was noted during this study (Min O2 = 87.00%). - The patient snored with Moderate snoring volume. - No cardiac abnormalities were noted during this study. - Clinically significant periodic limb movements did not occur during sleep. No significant associated arousals.  DIAGNOSIS - Obstructive Sleep Apnea (327.23 [G47.33 ICD-10]  RECOMMENDATIONS - Very mild obstructive sleep apnea. Return to discuss treatment options. - Avoid alcohol, sedatives and other CNS depressants that may worsen sleep apnea and disrupt normal sleep architecture. - Sleep hygiene should be reviewed to assess factors that may improve sleep quality. - Weight management and regular exercise should be initiated or continued if appropriate.  Waymon BudgeYOUNG,Jaquell Seddon D Diplomate, American Board of Sleep Medicine  ELECTRONICALLY SIGNED ON:  09/28/2015, 9:04 AM [facility] PH: [phone]   FX: [phone] ACCREDITED BY THE AMERICAN ACADEMY OF SLEEP MEDICINE

## 2016-02-27 MED FILL — ?AMLODIPINE BESYLATE 5 MG T: 5 | 30 days supply | Qty: 30 | Fill #2

## 2016-04-19 ENCOUNTER — Other Ambulatory Visit: Payer: Self-pay | Admitting: Family Medicine

## 2016-04-19 DIAGNOSIS — R358 Other polyuria: Secondary | ICD-10-CM

## 2016-04-19 DIAGNOSIS — R3589 Other polyuria: Secondary | ICD-10-CM

## 2016-04-19 DIAGNOSIS — I10 Essential (primary) hypertension: Secondary | ICD-10-CM

## 2016-04-19 DIAGNOSIS — R350 Frequency of micturition: Secondary | ICD-10-CM

## 2016-04-19 MED FILL — AMLODIPINE BESYLATE 5 MG TA: 5 | 30 days supply | Qty: 30 | Fill #0

## 2016-04-22 ENCOUNTER — Encounter: Payer: Self-pay | Admitting: Family Medicine

## 2016-04-22 ENCOUNTER — Ambulatory Visit (INDEPENDENT_AMBULATORY_CARE_PROVIDER_SITE_OTHER): Payer: Self-pay | Admitting: Family Medicine

## 2016-04-22 VITALS — BP 150/102 | HR 70 | Temp 98.3°F | Resp 16 | Ht 72.0 in | Wt 215.0 lb

## 2016-04-22 DIAGNOSIS — R61 Generalized hyperhidrosis: Secondary | ICD-10-CM

## 2016-04-22 DIAGNOSIS — F17200 Nicotine dependence, unspecified, uncomplicated: Secondary | ICD-10-CM

## 2016-04-22 DIAGNOSIS — R6882 Decreased libido: Secondary | ICD-10-CM | POA: Insufficient documentation

## 2016-04-22 DIAGNOSIS — I10 Essential (primary) hypertension: Secondary | ICD-10-CM

## 2016-04-22 DIAGNOSIS — F329 Major depressive disorder, single episode, unspecified: Secondary | ICD-10-CM

## 2016-04-22 DIAGNOSIS — F32A Depression, unspecified: Secondary | ICD-10-CM

## 2016-04-22 HISTORY — DX: Decreased libido: R68.82

## 2016-04-22 LAB — POCT URINALYSIS DIP (DEVICE)
Bilirubin Urine: NEGATIVE
GLUCOSE, UA: NEGATIVE mg/dL
Hgb urine dipstick: NEGATIVE
Ketones, ur: NEGATIVE mg/dL
Leukocytes, UA: NEGATIVE
NITRITE: NEGATIVE
PH: 5.5 (ref 5.0–8.0)
PROTEIN: NEGATIVE mg/dL
Specific Gravity, Urine: 1.02 (ref 1.005–1.030)
Urobilinogen, UA: 0.2 mg/dL (ref 0.0–1.0)

## 2016-04-22 LAB — COMPLETE METABOLIC PANEL WITH GFR
ALBUMIN: 4.1 g/dL (ref 3.6–5.1)
ALT: 12 U/L (ref 9–46)
AST: 16 U/L (ref 10–40)
Alkaline Phosphatase: 68 U/L (ref 40–115)
BILIRUBIN TOTAL: 0.7 mg/dL (ref 0.2–1.2)
BUN: 14 mg/dL (ref 7–25)
CO2: 23 mmol/L (ref 20–31)
Calcium: 9.2 mg/dL (ref 8.6–10.3)
Chloride: 104 mmol/L (ref 98–110)
Creat: 0.95 mg/dL (ref 0.60–1.35)
GFR, Est African American: >89 mL/min (ref >=60)
GLUCOSE: 94 mg/dL (ref 65–99)
POTASSIUM: 4.4 mmol/L (ref 3.5–5.3)
SODIUM: 139 mmol/L (ref 135–146)
TOTAL PROTEIN: 6.4 g/dL (ref 6.1–8.1)

## 2016-04-22 LAB — POCT GLYCOSYLATED HEMOGLOBIN (HGB A1C): HEMOGLOBIN A1C: 4.8

## 2016-04-22 LAB — LIPID PANEL
CHOL/HDL RATIO: 2.3 ratio (ref <5.0)
Cholesterol: 180 mg/dL (ref <200)
HDL: 80 mg/dL (ref >40)
LDL CALC: 79 mg/dL (ref <100)
Triglycerides: 103 mg/dL (ref <150)
VLDL: 21 mg/dL (ref <30)

## 2016-04-22 LAB — TSH: TSH: 1.59 m[IU]/L (ref 0.40–4.50)

## 2016-04-22 MED ORDER — AMLODIPINE BESYLATE 5 MG PO TABS: 5.0000 mg | ORAL_TABLET | Freq: Every day | ORAL | 2 refills | Status: DC

## 2016-04-22 MED ORDER — BUPROPION HCL ER (XL) 150 MG PO TB24: 150.0000 mg | ORAL_TABLET | Freq: Every day | ORAL | 2 refills | Status: DC

## 2016-04-22 MED FILL — ?BUPROPION HCL XL 150 MG TA: 150 | 30 days supply | Qty: 60 | Fill #0

## 2016-04-22 NOTE — Patient Instructions (Addendum)
Will start a trial of Wellbutrin for depression. Start 150 mg daily for 4 days, if tolerated increase to 300 mg daily. Follow up in 1 month for depression.    Tobacco Use Disorder Tobacco use disorder (TUD) is a mental disorder. It is the long-term use of tobacco in spite of related health problems or difficulty with normal life activities. Tobacco is most commonly smoked as cigarettes and less commonly as cigars or pipes. Smokeless chewing tobacco and snuff are also popular. People with TUD get a feeling of extreme pleasure (euphoria) from using tobacco and have a desire to use it again and again. Repeated use of tobacco can cause problems. The addictive effects of tobacco are due mainly tothe ingredient nicotine. Nicotine also causes a rush of adrenaline (epinephrine) in the body. This leads to increased blood pressure, heart rate, and breathing rate. These changes may cause problems for people with high blood pressure, weak hearts, or lung disease. High doses of nicotine in children and pets can lead to seizures and death. Tobacco contains a number of other unsafe chemicals. These chemicals are especially harmful when inhaled as smoke and can damage almost every organ in the body. Smokers live shorter lives than nonsmokers and are at risk of dying from a number of diseases and cancers. Tobacco smoke can also cause health problems for nonsmokers (due to inhaling secondhand smoke). Smoking is also a fire hazard. TUD usually starts in the late teenage years and is most common in young adults between the ages of 7518 and 25 years. People who start smoking earlier in life are more likely to continue smoking as adults. TUD is somewhat more common in men than women. People with TUD are at higher risk for using alcohol and other drugs of abuse. What increases the risk? Risk factors for TUD include:  Having family members with the disorder.  Being around people who use tobacco.  Having an existing mental  health issue such as schizophrenia, depression, bipolar disorder, ADHD, or posttraumatic stress disorder (PTSD). What are the signs or symptoms? People with tobacco use disorder have two or more of the following signs and symptoms within 12 months:  Use of more tobacco over a longer period than intended.  Not able to cut down or control tobacco use.  A lot of time spent obtaining or using tobacco.  Strong desire or urge to use tobacco (craving). Cravings may last for 6 months or longer after quitting.  Use of tobacco even when use leads to major problems at work, school, or home.  Use of tobacco even when use leads to relationship problems.  Giving up or cutting down on important life activities because of tobacco use.  Repeatedly using tobacco in situations where it puts you or others in physical danger, like smoking in bed.  Use of tobacco even when it is known that a physical or mental problem is likely related to tobacco use.  Physical problems are numerous and may include chronic bronchitis, emphysema, lung and other cancers, gum disease, high blood pressure, heart disease, and stroke.  Mental problems caused by tobacco may include difficulty sleeping and anxiety.  Need to use greater amounts of tobacco to get the same effect. This means you have developed a tolerance.  Withdrawal symptoms as a result of stopping or rapidly cutting back use. These symptoms may last a month or more after quitting and include the following:  Depressed, anxious, or irritable mood.  Difficulty concentrating.  Increased appetite.  Restlessness  or trouble sleeping.  Use of tobacco to avoid withdrawal symptoms. How is this diagnosed? Tobacco use disorder is diagnosed by your health care provider. A diagnosis may be made by:  Your health care provider asking questions about your tobacco use and any problems it may be causing.  A physical exam.  Lab tests.  You may be referred to a mental  health professional or addiction specialist. The severity of tobacco use disorder depends on the number of signs and symptoms you have:  Mild-Two or three symptoms.  Moderate-Four or five symptoms.  Severe-Six or more symptoms. How is this treated? Many people with tobacco use disorder are unable to quit on their own and need help. Treatment options include the following:  Nicotine replacement therapy (NRT). NRT provides nicotine without the other harmful chemicals in tobacco. NRT gradually lowers the dosage of nicotine in the body and reduces withdrawal symptoms. NRT is available in over-the-counter forms (gum, lozenges, and skin patches) as well as prescription forms (mouth inhaler and nasal spray).  Medicines.This may include:  Antidepressant medicine that may reduce nicotine cravings.  A medicine that acts on nicotine receptors in the brain to reduce cravings and withdrawal symptoms. It may also block the effects of tobacco in people with TUD who relapse.  Counseling or talk therapy. A form of talk therapy called behavioral therapy is commonly used to treat people with TUD. Behavioral therapy looks at triggers for tobacco use, how to avoid them, and how to cope with cravings. It is most effective in person or by phone but is also available in self-help forms (books and Internet websites).  Support groups. These provide emotional support, advice, and guidance for quitting tobacco. The most effective treatment for TUD is usually a combination of medicine, talk therapy, and support groups. Follow these instructions at home:  Keep all follow-up visits as directed by your health care provider. This is important.  Take medicines only as directed by your health care provider.  Check with your health care provider before starting new prescription or over-the-counter medicines. Contact a health care provider if:  You are not able to take your medicines as prescribed.  Treatment is not  helping your TUD and your symptoms get worse. Get help right away if:  You have serious thoughts about hurting yourself or others.  You have trouble breathing, chest pain, sudden weakness, or sudden numbness in part of your body. This information is not intended to replace advice given to you by your health care provider. Make sure you discuss any questions you have with your health care provider. Document Released: 12/24/2003 Document Revised: 12/21/2015 Document Reviewed: 06/15/2013 Elsevier Interactive Patient Education  2017 [employer]

## 2016-04-22 NOTE — Progress Notes (Signed)
Subjective:    Patient ID: Joshua KlinefelterHarvey Joe Pollett Jr., male    DOB: 01-27-1971, 45 y.o.   MRN: [id number] Mr. Joshua Blake, a 45 year old male presents accompanied by wife for a follow-up of hypertension. He currently does not exercise or follow a low sodium diet. He is a heavy tobacco user, he smokes 1 pack per day. Patient denies chest pain, dyspnea, fatigue, lower extremity edema, orthopnea, syncope and tachypnea.  Cardiovascular risk factors include: smoking/ tobacco exposure. Patient complains of depression. Mr. Joshua Blake lost his son in September to a tragic shooting. He has been feeling sad and hopeless.  He complains of anhedonia, depressed mood, difficulty concentrating, fatigue and hypersomnia. Patient reports that counseling has not been effective.  He denies current suicidal and homicidal plan or intent.    Hypertension  The current episode started more than 1 year ago. The problem is uncontrolled. Pertinent negatives include no chest pain, headaches, PND, shortness of breath or sweats. Risk factors for coronary artery disease include male gender, smoking/tobacco exposure and stress. Past treatments include calcium channel blockers. The current treatment provides moderate improvement. There are no compliance problems.  There is no history of angina, kidney disease, left ventricular hypertrophy or a thyroid problem.  Depression       The patient presents with depression.  This is a new problem.  The current episode started more than 1 month ago.   The onset quality is gradual.   The problem occurs intermittently.  Associated symptoms include fatigue (excessive daytime sleepiness).  Associated symptoms include no myalgias, no headaches and no suicidal ideas.     The symptoms are aggravated by family issues.  Compliance with treatment is good.  Past medical history includes chronic pain, depression and post-traumatic stress disorder.     Pertinent negatives include no chronic fatigue syndrome, no  fibromyalgia, no hypothyroidism, no thyroid problem and no suicide attempts.  Past Medical History:  Diagnosis Date  . Hypertension    Social History   Social History  . Marital status: Single    Spouse name: N/A  . Number of children: N/A  . Years of education: N/A   Occupational History  . Not on file.   Social History Main Topics  . Smoking status: Current Every Day Smoker    Packs/day: 1.00  . Smokeless tobacco: Current User  . Alcohol use Yes     Comment: 2 a day   . Drug use: No  . Sexual activity: Not on file   Other Topics Concern  . Not on file   Social History Narrative  . No narrative on file   No Known Allergies  There is no immunization history on file for this patient.  Review of Systems  Constitutional: Positive for fatigue (excessive daytime sleepiness).  HENT: Negative.   Eyes: Negative.   Respiratory: Negative for shortness of breath.   Cardiovascular: Negative.  Negative for chest pain and PND.  Gastrointestinal: Negative.   Endocrine: Negative for polyphagia and polyuria.  Genitourinary: Negative for decreased urine volume and urgency.  Musculoskeletal: Negative.  Negative for myalgias.  Skin: Negative.   Allergic/Immunologic: Negative.   Neurological: Negative.  Negative for headaches.  Hematological: Negative.   Psychiatric/Behavioral: Positive for depression. Negative for sleep disturbance and suicidal ideas.       Depression      Objective:   Physical Exam  Constitutional: He is oriented to person, place, and time. He appears well-developed and well-nourished.  HENT:  Head: Normocephalic  and atraumatic.  Right Ear: External ear normal.  Left Ear: External ear normal.  Mouth/Throat: Oropharynx is clear and moist.  Eyes: Conjunctivae and EOM are normal. Pupils are equal, round, and reactive to light.  Neck: Normal range of motion. Neck supple. No thyromegaly present.  Cardiovascular: Normal rate, regular rhythm, normal heart sounds  and intact distal pulses.   Pulmonary/Chest: Effort normal and breath sounds normal.  Abdominal: Soft. Bowel sounds are normal. He exhibits no distension. There is no tenderness.  Musculoskeletal: Normal range of motion. He exhibits no edema or tenderness.  Neurological: He is alert and oriented to person, place, and time. He displays normal reflexes. He exhibits normal muscle tone.  Skin: Skin is warm and dry.  Psychiatric: He has a normal mood and affect. His behavior is normal. Judgment and thought content normal.      BP (!) 150/102 (BP Location: Right Arm, Patient Position: Sitting, Cuff Size: Large)   Pulse 70   Temp 98.3 F (36.8 C) (Oral)   Resp 16   Ht 6' (1.829 m)   Wt 215 lb (97.5 kg)   SpO2 100%   BMI 29.16 kg/m   Assessment & Plan:  1. Essential hypertension Patient has been out of medications. Blood pressure is above goal. Will re-start medications. The patient is asked to make an attempt to improve diet and exercise patterns to aid in medical management of this problem. - amLODipine (NORVASC) 5 MG tablet; Take 1 tablet (5 mg total) by mouth daily.  Dispense: 90 tablet; Refill: 2 - COMPLETE METABOLIC PANEL WITH GFR - Lipid Panel - POCT urinalysis dip (device)  2. Depression, unspecified depression type  - buPROPion (WELLBUTRIN XL) 150 MG 24 hr tablet; Take 1 tablet (150 mg total) by mouth daily.  Dispense: 60 tablet; Refill: 2 [facility] 04/22/2016 04/22/2016 04/03/2015 02/27/2015  Decreased Interest 2 1 0 0  Down, Depressed, Hopeless 1 2 0 -  PHQ - 2 Score 3 3 0 0  Altered sleeping 1 - - -  Tired, decreased energy 2 - - -  Change in appetite 2 - - -  Feeling bad or failure about yourself  0 - - -  Trouble concentrating 1 - - -  Moving slowly or fidgety/restless 0 - - -  Suicidal thoughts 0 - - -  PHQ-9 Score 9 - - -  Difficult doing work/chores Somewhat difficult - - -    3. Diaphoresis - HgB A1c  4. Libido, decreased - Testosterone  5.  Tobacco dependence Smoking cessation instruction/counseling given:  counseled patient on the dangers of tobacco use, advised patient to stop smoking, and reviewed strategies to maximize success  RTC: 1 month for hypertension and depression   Keiland Pickering M, FNP

## 2016-04-23 LAB — TESTOSTERONE: Testosterone: 458 ng/dL (ref 250–827)

## 2016-05-14 ENCOUNTER — Other Ambulatory Visit: Payer: Self-pay | Admitting: Family Medicine

## 2016-05-14 DIAGNOSIS — R3589 Other polyuria: Secondary | ICD-10-CM

## 2016-05-14 DIAGNOSIS — R358 Other polyuria: Secondary | ICD-10-CM

## 2016-05-14 DIAGNOSIS — R350 Frequency of micturition: Secondary | ICD-10-CM

## 2016-05-14 DIAGNOSIS — I10 Essential (primary) hypertension: Secondary | ICD-10-CM

## 2016-05-14 MED FILL — ?AMLODIPINE BESYLATE 5 MG T: 5 | 30 days supply | Qty: 30 | Fill #0

## 2016-05-25 ENCOUNTER — Ambulatory Visit: Payer: Self-pay | Admitting: Family Medicine

## 2016-05-26 ENCOUNTER — Telehealth: Payer: Self-pay

## 2016-05-26 DIAGNOSIS — I10 Essential (primary) hypertension: Secondary | ICD-10-CM

## 2016-05-26 MED ORDER — AMLODIPINE BESYLATE 5 MG PO TABS: 5.0000 mg | ORAL_TABLET | Freq: Every day | ORAL | 2 refills | Status: DC

## 2016-05-26 NOTE — Telephone Encounter (Signed)
Refill sent into pharmacy. Thanks!  

## 2016-06-24 MED FILL — AMLODIPINE BESYLATE 5 MG TA: 5 | 30 days supply | Qty: 30 | Fill #0

## 2016-07-16 ENCOUNTER — Encounter (INDEPENDENT_AMBULATORY_CARE_PROVIDER_SITE_OTHER): Payer: Self-pay | Admitting: Orthopaedic Surgery

## 2016-07-16 ENCOUNTER — Ambulatory Visit (INDEPENDENT_AMBULATORY_CARE_PROVIDER_SITE_OTHER): Payer: Commercial Managed Care - PPO | Admitting: Orthopaedic Surgery

## 2016-07-16 DIAGNOSIS — M79672 Pain in left foot: Secondary | ICD-10-CM

## 2016-07-16 NOTE — Progress Notes (Signed)
Office Visit Note   Patient: Joshua KlinefelterHarvey Joe Speyer Jr.           Date of Birth: 09-22-1970           MRN: [id number] Visit Date: 07/16/2016              Requested by: Massie MaroonLachina M Hollis, FNP [address] [place], [place] [address] PCP: Massie MaroonHollis,Lachina M, FNP   Assessment & Plan: Visit Diagnoses:  1. Left foot pain     Plan: I discussed with the patient and his wife that he will likely have some degree of chronic pain permanently secondary to his surgery. I think an orthotic would help support this region. Unhappy to put him on work restrictions but this may permanently restrict his ability to obtain employment in the future. For now the just want prescription orthotic which I have provided. I'll see him back as needed. Questions encouraged and answered. Total face to face encounter time was greater than 25 minutes and over half of this time was spent in counseling and/or coordination of care.  Follow-Up Instructions: Return if symptoms worsen or fail to improve.   Orders:  No orders of the defined types were placed in this encounter.  No orders of the defined types were placed in this encounter.     Procedures: No procedures performed   Clinical Data: No additional findings.   Subjective: Chief Complaint  Patient presents with  . Left Foot - Pain, Follow-up    Patient comes in today for follow-up of his left foot pain. He had a previous left fifth metatarsal head resection for osteomyelitis. MRI showed no residual infection. He continues to have pain directly plantar to the surgical area. He denies any constitutional symptoms. He is on his feet all day for work. He was never able to get the orthotic due to lack of insurance    Review of Systems  Constitutional: Negative.   All other systems reviewed and are negative.    Objective: Vital Signs: There were no vitals taken for this visit.  Physical Exam  Constitutional: He is oriented to person, place, and  time. He appears well-developed and well-nourished.  Pulmonary/Chest: Effort normal.  Abdominal: Soft.  Neurological: He is alert and oriented to person, place, and time.  Skin: Skin is warm.  Psychiatric: He has a normal mood and affect. His behavior is normal. Judgment and thought content normal.  Nursing note and vitals reviewed.   Ortho Exam Exam of the left foot shows well-healed surgical scar. He is very tender underneath where the fifth metatarsal head was. He does have a floating toe. There is no signs of infection. Specialty Comments:  No specialty comments available.  Imaging: No results found.   PMFS History: Patient Active Problem List   Diagnosis Date Noted  . Libido, decreased 04/22/2016  . Tobacco dependence 04/03/2015  . Essential hypertension 02/27/2015  . Frequency of urination and polyuria 02/27/2015   Past Medical History:  Diagnosis Date  . Hypertension     Family History  Problem Relation Age of Onset  . Hypertension Mother   . Hypertension Father   . Diabetes Maternal Grandmother     Past Surgical History:  Procedure Laterality Date  . FOOT SURGERY     Social History   Occupational History  . Not on file.   Social History Main Topics  . Smoking status: Current Every Day Smoker    Packs/day: 1.00  . Smokeless tobacco: Current User  .  Alcohol use Yes     Comment: 2 a day   . Drug use: No  . Sexual activity: Not on file

## 2016-08-16 MED FILL — AMLODIPINE BESYLATE 5 MG TA: 5 | 30 days supply | Qty: 30 | Fill #1

## 2016-09-08 MED FILL — AMLODIPINE BESYLATE 5 MG TA: 5 | 30 days supply | Qty: 30 | Fill #2

## 2016-09-09 ENCOUNTER — Encounter (HOSPITAL_COMMUNITY): Payer: Self-pay | Admitting: Nurse Practitioner

## 2016-09-09 ENCOUNTER — Emergency Department (HOSPITAL_BASED_OUTPATIENT_CLINIC_OR_DEPARTMENT_OTHER)
Admission: EM | Admit: 2016-09-09 | Discharge: 2016-09-10 | Disposition: A | Discharge status: Home / Self Care | Payer: Commercial Managed Care - PPO | Attending: Emergency Medicine | Admitting: Emergency Medicine

## 2016-09-09 ENCOUNTER — Encounter: Payer: Self-pay | Admitting: Emergency Medicine

## 2016-09-09 ENCOUNTER — Emergency Department (HOSPITAL_COMMUNITY)
Admission: EM | Admit: 2016-09-09 | Discharge: 2016-09-09 | Disposition: A | Discharge status: Home / Self Care | Payer: Commercial Managed Care - PPO | Source: Home / Self Care

## 2016-09-09 ENCOUNTER — Emergency Department (INDEPENDENT_AMBULATORY_CARE_PROVIDER_SITE_OTHER)
Admission: EM | Admit: 2016-09-09 | Discharge: 2016-09-09 | Disposition: A | Discharge status: Other Acute Inpatient Hospital | Payer: Commercial Managed Care - PPO | Source: Home / Self Care | Attending: Family Medicine | Admitting: Family Medicine

## 2016-09-09 ENCOUNTER — Encounter (HOSPITAL_BASED_OUTPATIENT_CLINIC_OR_DEPARTMENT_OTHER): Payer: Self-pay

## 2016-09-09 DIAGNOSIS — R1084 Generalized abdominal pain: Secondary | ICD-10-CM | POA: Insufficient documentation

## 2016-09-09 DIAGNOSIS — R112 Nausea with vomiting, unspecified: Secondary | ICD-10-CM | POA: Diagnosis not present

## 2016-09-09 DIAGNOSIS — R509 Fever, unspecified: Secondary | ICD-10-CM | POA: Insufficient documentation

## 2016-09-09 DIAGNOSIS — Z5321 Procedure and treatment not carried out due to patient leaving prior to being seen by health care provider: Secondary | ICD-10-CM | POA: Insufficient documentation

## 2016-09-09 DIAGNOSIS — E860 Dehydration: Secondary | ICD-10-CM

## 2016-09-09 DIAGNOSIS — R197 Diarrhea, unspecified: Secondary | ICD-10-CM | POA: Diagnosis not present

## 2016-09-09 DIAGNOSIS — R531 Weakness: Secondary | ICD-10-CM | POA: Diagnosis not present

## 2016-09-09 DIAGNOSIS — F17200 Nicotine dependence, unspecified, uncomplicated: Secondary | ICD-10-CM

## 2016-09-09 DIAGNOSIS — I10 Essential (primary) hypertension: Secondary | ICD-10-CM | POA: Insufficient documentation

## 2016-09-09 LAB — URINALYSIS, ROUTINE W REFLEX MICROSCOPIC
Bilirubin Urine: NEGATIVE
GLUCOSE, UA: NEGATIVE mg/dL
Hgb urine dipstick: NEGATIVE
KETONES UR: 40 mg/dL — AB
LEUKOCYTES UA: NEGATIVE
Nitrite: NEGATIVE
PH: 7.5 (ref 5.0–8.0)
Protein, ur: NEGATIVE mg/dL
SPECIFIC GRAVITY, URINE: 1.013 (ref 1.005–1.030)

## 2016-09-09 LAB — CBC
HCT: 42.1 % (ref 39.0–52.0)
HEMOGLOBIN: 14.3 g/dL (ref 13.0–17.0)
MCH: 31.8 pg (ref 26.0–34.0)
MCHC: 34 g/dL (ref 30.0–36.0)
MCV: 93.6 fL (ref 78.0–100.0)
PLATELETS: 253 10*3/uL (ref 150–400)
RBC: 4.5 MIL/uL (ref 4.22–5.81)
RDW: 11.9 % (ref 11.5–15.5)
WBC: 11 10*3/uL — ABNORMAL HIGH (ref 4.0–10.5)

## 2016-09-09 LAB — COMPREHENSIVE METABOLIC PANEL
ALT: 23 U/L (ref 17–63)
AST: 30 U/L (ref 15–41)
Albumin: 4.4 g/dL (ref 3.5–5.0)
Alkaline Phosphatase: 79 U/L (ref 38–126)
Anion gap: 12 (ref 5–15)
BUN: 8 mg/dL (ref 6–20)
CHLORIDE: 101 mmol/L (ref 101–111)
CO2: 22 mmol/L (ref 22–32)
CREATININE: 1.08 mg/dL (ref 0.61–1.24)
Calcium: 9.5 mg/dL (ref 8.9–10.3)
GFR calc non Af Amer: >60 mL/min (ref >60)
Glucose, Bld: 139 mg/dL — ABNORMAL HIGH (ref 65–99)
Potassium: 3.8 mmol/L (ref 3.5–5.1)
SODIUM: 135 mmol/L (ref 135–145)
Total Bilirubin: 1.3 mg/dL — ABNORMAL HIGH (ref 0.3–1.2)
Total Protein: 6.7 g/dL (ref 6.5–8.1)

## 2016-09-09 LAB — LIPASE, BLOOD: Lipase: 25 U/L (ref 11–51)

## 2016-09-09 MED ORDER — ONDANSETRON HCL 4 MG PO TABS: 4.0000 mg | ORAL_TABLET | Freq: Four times a day (QID) | ORAL | 0 refills | Status: DC

## 2016-09-09 MED ORDER — DEXTROSE 5 % IV BOLUS
1000.0000 mL | Freq: Once | INTRAVENOUS | Status: AC
  Administered 2016-09-09: 1000 mL via INTRAVENOUS

## 2016-09-09 MED ORDER — ONDANSETRON HCL 4 MG/2ML IJ SOLN
4.0000 mg | Freq: Once | INTRAMUSCULAR | Status: AC
  Administered 2016-09-09: 4 mg via INTRAVENOUS
  Filled 2016-09-09: qty 2

## 2016-09-09 MED ORDER — METOCLOPRAMIDE HCL 5 MG/ML IJ SOLN
10.0000 mg | Freq: Once | INTRAMUSCULAR | Status: AC
  Administered 2016-09-09: 10 mg via INTRAVENOUS
  Filled 2016-09-09: qty 2

## 2016-09-09 MED ORDER — PROMETHAZINE HCL 25 MG/ML IJ SOLN
25.0000 mg | Freq: Once | INTRAMUSCULAR | Status: AC
  Administered 2016-09-09: 25 mg via INTRAMUSCULAR

## 2016-09-09 MED ORDER — ONDANSETRON 4 MG PO TBDP
4.0000 mg | ORAL_TABLET | Freq: Once | ORAL | Status: AC | PRN
  Administered 2016-09-09: 4 mg via ORAL

## 2016-09-09 MED ORDER — SODIUM CHLORIDE 0.9 % IV BOLUS (SEPSIS)
1000.0000 mL | Freq: Once | INTRAVENOUS | Status: AC
  Administered 2016-09-09: 1000 mL via INTRAVENOUS

## 2016-09-09 MED ORDER — ONDANSETRON 4 MG PO TBDP
ORAL_TABLET | ORAL | Status: AC
  Filled 2016-09-09: qty 1

## 2016-09-09 NOTE — ED Provider Notes (Signed)
[facility]   CSN: [id number] Arrival date & time: 09/09/16  4098   History   Chief Complaint Chief Complaint  Patient presents with  . Emesis    HPI Joshua Blake. is a 46 y.o. male.  HPI Patient presents to ED from UC with severe abdominal pain associated with n/v/d. PMH significant for HTN. Symptoms started this morning. Patient has lost track of how many times he has vomited today. Denies any blood in vomit. Vomit has now turned a yellowish-green color. Patient unable to keep anything down. He has diffuse abdominal pain, 10/10, cramping and sore. Abdominal pain first started off below the umbilicus and now is generalized. Was well prior to this. Associated symtpoms include chills, feverish, weakness. He has no known history of abdominal surgeries. No recetn travel. May have been exposed to a sick grandchild with stomach pain on Monday. No new foods or medications.   Of note, patient originally went to [facility] for work-up but wait was 4 hours so left without being seen. Blood work was however collected there. He then went to [facility] in [place] who sent him here.    Past Medical History:  Diagnosis Date  . Hypertension     Patient Active Problem List   Diagnosis Date Noted  . Libido, decreased 04/22/2016  . Tobacco dependence 04/03/2015  . Essential hypertension 02/27/2015  . Frequency of urination and polyuria 02/27/2015    Past Surgical History:  Procedure Laterality Date  . FOOT SURGERY      Home Medications    Prior to Admission medications   Medication Sig Start Date End Date Taking? Authorizing Provider  amLODipine (NORVASC) 5 MG tablet TAKE 1 TABLET BY MOUTH DAILY 05/14/16   Massie Maroon, FNP    Family History Family History  Problem Relation Age of Onset  . Hypertension Mother   . Hypertension Father   . Diabetes Maternal Grandmother     Social History Social History  Substance Use Topics  . Smoking status: Current  Every Day Smoker    Packs/day: 1.00  . Smokeless tobacco: Current User  . Alcohol use Yes     Comment: daily    Allergies   Patient has no known allergies.   Review of Systems Review of Systems  Constitutional: Positive for appetite change, chills and fever.  Respiratory: Negative for shortness of breath.   Cardiovascular: Negative for chest pain.  Gastrointestinal: Positive for abdominal pain, diarrhea, nausea and vomiting. Negative for blood in stool.  Neurological: Positive for weakness.  Also per HPI  Physical Exam Updated Vital Signs BP (!) 158/96 (BP Location: Left Arm)   Pulse 62   Temp 98.3 F (36.8 C) (Oral)   Resp 16   SpO2 100%   Physical Exam  Constitutional: He is oriented to person, place, and time. He appears well-developed and well-nourished. No distress.  HENT:  Head: Normocephalic and atraumatic.  Mouth/Throat: Oropharynx is clear and moist.  Eyes: Conjunctivae and EOM are normal. Pupils are equal, round, and reactive to light.  Neck: Normal range of motion.  Cardiovascular: Normal rate, regular rhythm, normal heart sounds and intact distal pulses.   Pulmonary/Chest: Effort normal and breath sounds normal.  Abdominal: Soft. He exhibits no distension. There is generalized tenderness. There is no rigidity and no rebound.  Musculoskeletal: Normal range of motion. He exhibits no edema.  Neurological: He is alert and oriented to person, place, and time.  No focal deficits appreciated  Skin: Skin is  warm and dry.  Nursing note and vitals reviewed.   ED Treatments / Results  Labs (all labs ordered are listed, but only abnormal results are displayed) Labs Reviewed  URINALYSIS, ROUTINE W REFLEX MICROSCOPIC - Abnormal; Notable for the following:       Result Value   Ketones, ur 40 (*)    All other components within normal limits    EKG  EKG Interpretation None       Radiology No results found.  Procedures Procedures (including critical care  time)  Medications Ordered in ED Medications  ondansetron (ZOFRAN) injection 4 mg (4 mg Intravenous Given 09/09/16 1936)  sodium chloride 0.9 % bolus 1,000 mL (0 mLs Intravenous Stopped 09/09/16 2033)  sodium chloride 0.9 % bolus 1,000 mL (0 mLs Intravenous Stopped 09/09/16 2201)  metoCLOPramide (REGLAN) injection 10 mg (10 mg Intravenous Given 09/09/16 2207)    Initial Impression / Assessment and Plan / ED Course  I have reviewed the triage vital signs and the nursing notes.  Pertinent labs & imaging results that were available during my care of the patient were reviewed by me and considered in my medical decision making (see chart for details).  Patient presented to ED with abdominal pain with n/v/d. Symptoms started acutely this morning. Vitals are stable and patient is afebrile here in ED. Has history of hypertension and BP elevated in ED due to being unable to take BP medication and likely also pain. Labs from ED reviewed showing no acidosis, electrolytes stable, mild leukocytosis, normal lipase, and urine with ketones.   Patient hydrated with 2L bolus. Given antiemetics. Had initially emesis with zofran so given a dose of reglan.   Shift ending so care being transferred to oncoming provider to reexamine patient after additional bolus of fluids. May consider abdominal imaging if not feeling better.   Symptoms most consistent with a gastroenteritis most likely from rotavirus.   Final Clinical Impressions(s) / ED Diagnoses   Final diagnoses:  None    New Prescriptions New Prescriptions   No medications on file     Pincus Largehelps, Tayvin Preslar Y, DO 09/09/16 2320    Gwyneth SproutPlunkett, Whitney, MD 09/11/16 [phone]

## 2016-09-09 NOTE — ED Notes (Signed)
Pts family member concerned about wait time.  Instructed to the patient that there was a wait and offered to help the patient feel more comfortable if any way possible, patient was given nausea medication in triage and a blanket for comfort.  Instructed to family that the patients blood work was also in process.  Despise efforts to make patient comfortable while he waited for a room the family member insisted on taking him somewhere else.  Appreciative for efforts.

## 2016-09-09 NOTE — ED Triage Notes (Addendum)
Pt presents with nausea, vomiting, diarrhea. His symptoms began this morning and have been persistent throughout the day. He denies fevers. Reports abdominal pain. He took pepto bismol at home with no relief. No one in house with similar symptoms.

## 2016-09-09 NOTE — ED Triage Notes (Signed)
Pt c/o stomach pain, N+V+D that started this am. He has tried zofran and pepto with no relief.

## 2016-09-09 NOTE — ED Provider Notes (Signed)
CSN: [id number]     Arrival date & time 09/09/16  1825 History   First MD Initiated Contact with Patient 09/09/16 1839     Chief Complaint  Patient presents with  . Emesis   (Consider location/radiation/quality/duration/timing/severity/associated sxs/prior Treatment) HPI  Joshua Blake. is a 46 y.o. male presenting to UC with wife with c/o n/v/d since this morning.  He has lost track of how many times he has vomited today. He has diffuse abdominal pain, 10/10, cramping and sore.  Generalized weakness as he has not been able to eat or drink all day.  He initially went to the emergency department at [facility] but after getting blood drawn and given Zofran 4mg  ODT, they were informed it would be a 4 hour wait.  He has not stopped vomiting. No hx of abdominal surgeries. No recent travel. He ate the same dinner as his wife who is not having any symptoms. No sick contacts.     Past Medical History:  Diagnosis Date  . Hypertension    Past Surgical History:  Procedure Laterality Date  . FOOT SURGERY     Family History  Problem Relation Age of Onset  . Hypertension Mother   . Hypertension Father   . Diabetes Maternal Grandmother    Social History  Substance Use Topics  . Smoking status: Current Every Day Smoker    Packs/day: 1.00  . Smokeless tobacco: Current User  . Alcohol use Yes     Comment: 5-6 beers daily    Review of Systems  Constitutional: Positive for appetite change, chills and fatigue. Negative for fever.  Respiratory: Negative for cough and shortness of breath.   Cardiovascular: Negative for chest pain and palpitations.  Gastrointestinal: Positive for abdominal pain, diarrhea, nausea and vomiting. Negative for blood in stool.  Musculoskeletal: Negative for arthralgias, back pain and myalgias.  Skin: Negative for rash.  Neurological: Positive for weakness and light-headedness. Negative for headaches.    Allergies  Patient has no known allergies.  Home  Medications   Prior to Admission medications   Medication Sig Start Date End Date Taking? Authorizing Provider  amLODipine (NORVASC) 5 MG tablet TAKE 1 TABLET BY MOUTH DAILY 05/14/16   Massie Maroon, FNP  amLODipine (NORVASC) 5 MG tablet Take 1 tablet (5 mg total) by mouth daily. Patient not taking: Reported on 07/16/2016 05/26/16   Massie Maroon, FNP  buPROPion (WELLBUTRIN XL) 150 MG 24 hr tablet Take 1 tablet (150 mg total) by mouth daily. Patient not taking: Reported on 07/16/2016 04/22/16   Massie Maroon, FNP   Meds Ordered and Administered this Visit   Medications  promethazine (PHENERGAN) injection 25 mg (25 mg Intramuscular Given 09/09/16 1852)    BP (!) 183/80 (BP Location: Right Arm)   Pulse 60   Temp 98.6 F (37 C) (Oral)   SpO2 100%  No data found.   Physical Exam  Constitutional: He is oriented to person, place, and time. He appears well-developed and well-nourished.  Pt lying on exam bed. Appears acutely ill. Alert and cooperative but does not appear to feel well.  Pt holding emesis bag as he just vomited.  HENT:  Head: Normocephalic and atraumatic.  Eyes: EOM are normal.  Neck: Normal range of motion.  Cardiovascular: Normal rate and regular rhythm.   Pulmonary/Chest: Effort normal and breath sounds normal. No respiratory distress. He has no wheezes. He has no rales.  Abdominal: Soft. He exhibits no distension and no mass. There is  tenderness. There is no rebound and no guarding.  Musculoskeletal: Normal range of motion.  Neurological: He is alert and oriented to person, place, and time.  Skin: Skin is warm. He is diaphoretic.  Psychiatric: He has a normal mood and affect. His behavior is normal.  Nursing note and vitals reviewed.   Urgent Care Course     Procedures (including critical care time)  Labs Review Labs Reviewed - No data to display  Imaging Review No results found.    MDM   1. Nausea vomiting and diarrhea   2. Mild dehydration    3. Generalized abdominal pain    Pt presenting to UC with n/v/d and 10/10 abdominal pain. Vitals: WNL  [facility] that were done in triage.   CBC: WBC- 11.0 Glucose 139 despite reports of not being able to keep anything down. No hx of DM.   Labs otherwise unremarkable.  Due to amount of times pt notes he vomited despite Zofran and pepto bismol and associated severe abdominal pain, strongly suggest pt go to emergency department for IV fluids and further workup.    Phenergan 25mg  IM given in UC prior to discharge. Wife plans [place] [facility]. Pt discharged in stable condition.       Junius FinnerO'Malley, Navika Hoopes, PA-C 09/09/16 1900

## 2016-09-09 NOTE — ED Triage Notes (Signed)
Pt c/o n/v/d x today-[facility]-wife with pt-pt actively vomiting in triage-presents in w/c

## 2016-09-10 ENCOUNTER — Ambulatory Visit: Payer: Self-pay | Admitting: Family Medicine

## 2016-09-10 MED FILL — ?ONDANSETRON HCL 4 MG TABLE: 4 | 3 days supply | Qty: 12 | Fill #0

## 2016-09-10 NOTE — ED Notes (Signed)
Room temperature [place] given.  Encouraged patient to drink.  Patient and wife stated that every time he drinks, he has hiccups.

## 2016-09-11 ENCOUNTER — Telehealth: Payer: Self-pay | Admitting: Emergency Medicine

## 2016-09-11 NOTE — Telephone Encounter (Signed)
courtesy call to patient, feeling better.

## 2016-09-13 ENCOUNTER — Ambulatory Visit: Payer: Self-pay | Admitting: Family Medicine

## 2016-09-14 ENCOUNTER — Ambulatory Visit (INDEPENDENT_AMBULATORY_CARE_PROVIDER_SITE_OTHER): Payer: Commercial Managed Care - PPO | Admitting: Family Medicine

## 2016-09-14 ENCOUNTER — Encounter: Payer: Self-pay | Admitting: Family Medicine

## 2016-09-14 DIAGNOSIS — N529 Male erectile dysfunction, unspecified: Secondary | ICD-10-CM

## 2016-09-14 DIAGNOSIS — R6882 Decreased libido: Secondary | ICD-10-CM | POA: Diagnosis not present

## 2016-09-14 DIAGNOSIS — I10 Essential (primary) hypertension: Secondary | ICD-10-CM | POA: Diagnosis not present

## 2016-09-14 DIAGNOSIS — R110 Nausea: Secondary | ICD-10-CM | POA: Diagnosis not present

## 2016-09-14 LAB — POCT URINALYSIS DIP (DEVICE)
Bilirubin Urine: NEGATIVE
Glucose, UA: NEGATIVE mg/dL
Ketones, ur: NEGATIVE mg/dL
Leukocytes, UA: NEGATIVE
Nitrite: NEGATIVE
Protein, ur: NEGATIVE mg/dL
Specific Gravity, Urine: >=1.03 (ref 1.005–1.030)
Urobilinogen, UA: 0.2 mg/dL (ref 0.0–1.0)
pH: 5.5 (ref 5.0–8.0)

## 2016-09-14 LAB — CBC WITH DIFFERENTIAL/PLATELET
BASOS PCT: 1 %
Basophils Absolute: 63 cells/uL (ref 0–200)
EOS ABS: 252 {cells}/uL (ref 15–500)
Eosinophils Relative: 4 %
HEMATOCRIT: 41.9 % (ref 38.5–50.0)
HEMOGLOBIN: 13.8 g/dL (ref 13.2–17.1)
LYMPHS ABS: 1512 {cells}/uL (ref 850–3900)
LYMPHS PCT: 24 %
MCH: 32 pg (ref 27.0–33.0)
MCHC: 32.9 g/dL (ref 32.0–36.0)
MCV: 97.2 fL (ref 80.0–100.0)
MONO ABS: 315 {cells}/uL (ref 200–950)
MPV: 9.8 fL (ref 7.5–12.5)
Monocytes Relative: 5 %
Neutro Abs: 4158 cells/uL (ref 1500–7800)
Neutrophils Relative %: 66 %
Platelets: 268 10*3/uL (ref 140–400)
RBC: 4.31 MIL/uL (ref 4.20–5.80)
RDW: 12.9 % (ref 11.0–15.0)
WBC: 6.3 10*3/uL (ref 3.8–10.8)

## 2016-09-14 LAB — PSA: PSA: 1.3 ng/mL (ref <=4.0)

## 2016-09-14 MED ORDER — SILDENAFIL CITRATE 25 MG PO TABS: 25.0000 mg | ORAL_TABLET | Freq: Every day | ORAL | 0 refills | Status: DC | PRN

## 2016-09-14 MED ORDER — AMLODIPINE BESYLATE 5 MG PO TABS
5.0000 mg | ORAL_TABLET | Freq: Every day | ORAL | 1 refills | Status: DC
Start: 2016-09-14 — End: 2019-07-31

## 2016-09-14 MED ORDER — ONDANSETRON HCL 4 MG PO TABS: 4.0000 mg | ORAL_TABLET | Freq: Three times a day (TID) | ORAL | 0 refills | Status: DC | PRN

## 2016-09-14 NOTE — Progress Notes (Signed)
Subjective:    Patient ID: Joshua KlinefelterHarvey Joe Jubb Jr., male    DOB: 1970-08-11, 46 y.o.   MRN: [id number]   Joshua Blake, a 46 year old male with a history of hypertension presents complaining of nausea, vomiting, diarrhea, and erectile dysfunction.   Joshua Blake was evaluated in the emergency department on 09/09/2016 for nausea, vomiting and diarrhea. He endorses sick contacts. He says that his daughter had similar symptoms and 24 hours later he developed symptoms. He was prescribed Zofran every 6 hours and symptoms have improved. He endorses fatigue. Denies fever, headache, dizziness, nausea, or vomiting.   He also complains of erectile dysfunction and decrease libido.   Onset of symptoms was several months ago. He attributed it to depression after losing his son. Symptoms have worsened. He has difficulty achieving and maintaining an erection. Libido is also decrease. Full erections occur about 10 percent of time. Risk factors include history of hypertension and depression.     Past Medical History:  Diagnosis Date  . Hypertension    Social History   Social History  . Marital status: Married    Spouse name: N/A  . Number of children: N/A  . Years of education: N/A   Occupational History  . Not on file.   Social History Main Topics  . Smoking status: Current Every Day Smoker    Packs/day: 1.00  . Smokeless tobacco: Current User  . Alcohol use Yes     Comment: daily  . Drug use: No  . Sexual activity: Not on file   Other Topics Concern  . Not on file   Social History Narrative  . No narrative on file   No Known Allergies  There is no immunization history on file for this patient.  Review of Systems  Constitutional: Positive for appetite change (decreased) and fatigue. Negative for diaphoresis and fever.  HENT: Negative.   Eyes: Negative.  Negative for photophobia.  Respiratory: Negative.   Cardiovascular: Negative.   Gastrointestinal: Positive for diarrhea (Diarrhea  has dissipated) and nausea (Controlled on Zofran).  Endocrine: Negative for polydipsia, polyphagia and polyuria.  Genitourinary: Negative for decreased urine volume and urgency.       Erectile dysfunction  Musculoskeletal: Negative.   Skin: Negative.   Allergic/Immunologic: Negative.   Neurological: Positive for weakness.  Hematological: Negative.   Psychiatric/Behavioral: Negative for sleep disturbance.       Depression      Objective:   Physical Exam  Constitutional: He is oriented to person, place, and time. He appears well-developed and well-nourished.  HENT:  Head: Normocephalic and atraumatic.  Right Ear: External ear normal.  Left Ear: External ear normal.  Mouth/Throat: Oropharynx is clear and moist.  Eyes: Conjunctivae and EOM are normal. Pupils are equal, round, and reactive to light.  Neck: Normal range of motion. Neck supple. No thyromegaly present.  Cardiovascular: Normal rate, regular rhythm, normal heart sounds and intact distal pulses.   Pulmonary/Chest: Effort normal and breath sounds normal.  Abdominal: Soft. Bowel sounds are normal. He exhibits no distension. There is no tenderness.  Musculoskeletal: Normal range of motion. He exhibits no edema or tenderness.  Neurological: He is alert and oriented to person, place, and time. He displays normal reflexes. He exhibits normal muscle tone.  Skin: Skin is warm and dry.  Psychiatric: He has a normal mood and affect. His behavior is normal. Judgment and thought content normal.      BP (!) 138/92 (BP Location: Left Arm, Patient Position: Sitting,  Cuff Size: Normal) Comment: manual  Pulse 77   Temp 98.3 F (36.8 C) (Oral)   Resp 16   Ht 6' (1.829 m)   Wt 208 lb (94.3 kg)   SpO2 100%   BMI 28.21 kg/m   Assessment & Plan:  1. Nausea - CBC with Differential - ondansetron (ZOFRAN) 4 MG tablet; Take 1 tablet (4 mg total) by mouth every 8 (eight) hours as needed for nausea or vomiting.  Dispense: 20 tablet; Refill:  0  2. Essential hypertension Blood pressure is at goal on current medication regimen, will continue Amlodipine.  - amLODipine (NORVASC) 5 MG tablet; Take 1 tablet (5 mg total) by mouth daily.  Dispense: 90 tablet; Refill: 1  3. Erectile dysfunction, unspecified erectile dysfunction type Patient has had decreased libido and ED. He is requesting viagra for symptoms. Will also check PSA and testosterone - Testosterone Total,Free,Bio, Males - PSA - sildenafil (VIAGRA) 25 MG tablet; Take 1 tablet (25 mg total) by mouth daily as needed for erectile dysfunction.  Dispense: 10 tablet; Refill: 0  4. Libido, decreased - Testosterone Total,Free,Bio, Males - PSA   RTC: As previously scheduled   Nolon Nations  MSN, FNP-C [facility] Patient [facility] [address] [place], [place] [address] [phone]

## 2016-09-14 NOTE — Patient Instructions (Addendum)
Patient has been out of work since 09/09/2016 due to gastroenteritis. He can return to work on 09/14/2016 at full duties.   Continue Zofran 4 mg every 8 hours as needed for nausea Continue bland soft diet. Gradually introduce fat back into diet. Stay hydrated with up to 64 ounces of water daily.

## 2016-09-15 LAB — TESTOSTERONE TOTAL,FREE,BIO, MALES
ALBUMIN: 4.1 g/dL (ref 3.6–5.1)
SEX HORMONE BINDING: 40 nmol/L (ref 10–50)
TESTOSTERONE FREE: 30.3 pg/mL — AB (ref 46.0–224.0)
Testosterone, Bioavailable: 57 ng/dL — ABNORMAL LOW (ref 110.0–575.0)
Testosterone: 276 ng/dL (ref 250–827)

## 2016-09-17 ENCOUNTER — Encounter: Payer: Self-pay | Admitting: Family Medicine

## 2016-09-17 ENCOUNTER — Telehealth: Payer: Self-pay | Admitting: Family Medicine

## 2016-09-17 DIAGNOSIS — E291 Testicular hypofunction: Secondary | ICD-10-CM

## 2016-09-17 MED ORDER — TESTOSTERONE 50 MG/5GM (1%) TD GEL: 5.0000 g | Freq: Every day | TRANSDERMAL | 1 refills | Status: DC

## 2016-09-17 NOTE — Telephone Encounter (Signed)
Joshua Blake, a 46 year old male presented on 09/13/2016 complaining of low libido and erectile dysfunction. Reviewed labs, low testosterone. PSA normal.    Meds ordered this encounter  Medications  . testosterone (ANDROGEL) 50 MG/5GM (1%) GEL    Sig: Place 5 g onto the skin daily.    Dispense:  5 Package    Refill:  1     Joshua NationsLaChina Moore Yarelis Ambrosino  MSN, FNP-C [facility] Patient [facility] [address]  [place], [place] [address] [phone]

## 2016-09-23 ENCOUNTER — Other Ambulatory Visit: Payer: Self-pay | Admitting: Family Medicine

## 2016-09-23 DIAGNOSIS — E291 Testicular hypofunction: Secondary | ICD-10-CM

## 2016-09-23 MED ORDER — TESTOSTERONE 40.5 MG/2.5GM (1.62%) TD GEL: TRANSDERMAL | 1 refills | Status: DC

## 2016-09-23 NOTE — Progress Notes (Signed)
Androgen deficiency. Will schedule am follow up in 14 days to repeat testosterone.    Meds ordered this encounter  Medications  . Testosterone 40.5 MG/2.5GM (1.62%) GEL    Sig: Apply 40.5 mg daily (2 pumps) daily to shoulder(s) or upper arms(s).    Dispense:  2.5 g    Refill:  1     Joshua Varin Rennis PettyMoore Jayzen Paver  MSN, FNP-C [facility] Patient [facility] [address]  [place], [place] [address] [phone]

## 2016-10-01 ENCOUNTER — Other Ambulatory Visit (INDEPENDENT_AMBULATORY_CARE_PROVIDER_SITE_OTHER): Payer: Commercial Managed Care - PPO

## 2016-10-01 ENCOUNTER — Other Ambulatory Visit: Payer: Self-pay | Admitting: Family Medicine

## 2016-10-01 DIAGNOSIS — E291 Testicular hypofunction: Secondary | ICD-10-CM | POA: Diagnosis not present

## 2016-10-01 DIAGNOSIS — R6882 Decreased libido: Secondary | ICD-10-CM | POA: Diagnosis not present

## 2016-10-04 LAB — TESTOSTERONE TOTAL,FREE,BIO, MALES
Albumin: 3.9 g/dL (ref 3.6–5.1)
Sex Hormone Binding: 33 nmol/L (ref 10–50)
Testosterone, Bioavailable: 73 ng/dL — ABNORMAL LOW (ref 110.0–575.0)
Testosterone, Free: 40.6 pg/mL — ABNORMAL LOW (ref 46.0–224.0)
Testosterone: 308 ng/dL (ref 250–827)

## 2016-10-06 ENCOUNTER — Other Ambulatory Visit: Payer: Self-pay | Admitting: Family Medicine

## 2016-10-06 DIAGNOSIS — E291 Testicular hypofunction: Secondary | ICD-10-CM

## 2016-10-18 ENCOUNTER — Other Ambulatory Visit: Payer: Commercial Managed Care - PPO

## 2016-10-25 MED FILL — AMLODIPINE BESYLATE 5 MG TA: 5 | 30 days supply | Qty: 30 | Fill #3

## 2016-12-15 ENCOUNTER — Ambulatory Visit: Payer: Commercial Managed Care - PPO | Admitting: Family Medicine

## 2017-01-03 ENCOUNTER — Encounter (HOSPITAL_COMMUNITY): Payer: Self-pay | Admitting: *Deleted

## 2017-01-03 ENCOUNTER — Emergency Department (HOSPITAL_COMMUNITY)
Admission: EM | Admit: 2017-01-03 | Discharge: 2017-01-03 | Disposition: A | Discharge status: Home / Self Care | Payer: Commercial Managed Care - PPO | Attending: Emergency Medicine | Admitting: Emergency Medicine

## 2017-01-03 ENCOUNTER — Emergency Department (HOSPITAL_COMMUNITY): Payer: Commercial Managed Care - PPO

## 2017-01-03 DIAGNOSIS — Z79899 Other long term (current) drug therapy: Secondary | ICD-10-CM | POA: Insufficient documentation

## 2017-01-03 DIAGNOSIS — I10 Essential (primary) hypertension: Secondary | ICD-10-CM | POA: Insufficient documentation

## 2017-01-03 DIAGNOSIS — F17200 Nicotine dependence, unspecified, uncomplicated: Secondary | ICD-10-CM | POA: Insufficient documentation

## 2017-01-03 DIAGNOSIS — R002 Palpitations: Secondary | ICD-10-CM | POA: Insufficient documentation

## 2017-01-03 LAB — CBC
HEMATOCRIT: 40.5 % (ref 39.0–52.0)
Hemoglobin: 13.5 g/dL (ref 13.0–17.0)
MCH: 31.6 pg (ref 26.0–34.0)
MCHC: 33.3 g/dL (ref 30.0–36.0)
MCV: 94.8 fL (ref 78.0–100.0)
Platelets: 216 10*3/uL (ref 150–400)
RBC: 4.27 MIL/uL (ref 4.22–5.81)
RDW: 12.4 % (ref 11.5–15.5)
WBC: 9.3 10*3/uL (ref 4.0–10.5)

## 2017-01-03 LAB — BASIC METABOLIC PANEL
ANION GAP: 12 (ref 5–15)
BUN: 11 mg/dL (ref 6–20)
CALCIUM: 9.4 mg/dL (ref 8.9–10.3)
CO2: 21 mmol/L — ABNORMAL LOW (ref 22–32)
Chloride: 105 mmol/L (ref 101–111)
Creatinine, Ser: 0.89 mg/dL (ref 0.61–1.24)
GFR calc Af Amer: >60 mL/min (ref >60)
Glucose, Bld: 92 mg/dL (ref 65–99)
POTASSIUM: 4.2 mmol/L (ref 3.5–5.1)
SODIUM: 138 mmol/L (ref 135–145)

## 2017-01-03 LAB — I-STAT TROPONIN, ED
TROPONIN I, POC: 0.01 ng/mL (ref 0.00–0.08)
TROPONIN I, POC: 0.01 ng/mL (ref 0.00–0.08)

## 2017-01-03 NOTE — Discharge Instructions (Signed)
It was my pleasure taking care of you today!   You need to follow up with the cardiology clinic. Please call in the morning to schedule a follow up appointment. You will likely need a Holter monitor (heart monitor).   Please return to ER for chest pain, trouble breathing, new or worsening symptoms, any additional concerns.

## 2017-01-03 NOTE — ED Provider Notes (Signed)
[facility]-EMERGENCY DEPT Provider Note   CSN: [id number] Arrival date & time: 01/03/17  1751     History   Chief Complaint Chief Complaint  Patient presents with  . Chest Pain    HPI Joshua KlinefelterHarvey Joe Scholes Jr. is a 46 y.o. male.  The history is provided by the patient and medical records. No language interpreter was used.  Chest Pain   Associated symptoms include palpitations.   Joshua KlinefelterHarvey Joe Langlais Jr. is a 46 y.o. male  with a PMH of HTN who presents to the Emergency Department complaining of palpitations which began this morning around 8 am. Patient states that he had sudden onset of palpitations which were constant for about an hour and then self-resolved. He felt tired but otherwise alright. He then decided to take [place] to [place]. While there, he again developed palpitations. His wife checked his pulse and timed it using stop watch, measuring pulse of 140. During this, wife notes that he was sweaty and did not look like he felt well. This lasted about 30 minutes to an hour, then self resolved. He denies any associated chest pain or shortness of breath. Not on blood thinners or hx of blood clots. No leg swelling, calf pain, cough, recent travel/surgeries/immobilizations. No hx of heart failure, CAD, HLD. No abdominal pain, nausea, vomiting, headache. No medications taken prior to arrival. No history of similar sxs. He states that he has been in the ER for about 3 hours now with no complaints/symptoms.  Past Medical History:  Diagnosis Date  . Hypertension   . Testosterone deficiency in male     Patient Active Problem List   Diagnosis Date Noted  . Libido, decreased 04/22/2016  . Tobacco dependence 04/03/2015  . Essential hypertension 02/27/2015  . Frequency of urination and polyuria 02/27/2015    Past Surgical History:  Procedure Laterality Date  . FOOT SURGERY         Home Medications    Prior to Admission medications   Medication Sig Start Date End Date  Taking? Authorizing Provider  amLODipine (NORVASC) 5 MG tablet Take 1 tablet (5 mg total) by mouth daily. 09/14/16  Yes Massie MaroonHollis, Lachina M, FNP  sildenafil (VIAGRA) 25 MG tablet Take 1 tablet (25 mg total) by mouth daily as needed for erectile dysfunction. 09/14/16  Yes Massie MaroonHollis, Lachina M, FNP  ondansetron (ZOFRAN) 4 MG tablet Take 1 tablet (4 mg total) by mouth every 8 (eight) hours as needed for nausea or vomiting. Patient not taking: Reported on 01/03/2017 09/14/16   Massie MaroonHollis, Lachina M, FNP  Testosterone 40.5 MG/2.5GM (1.62%) GEL Apply 40.5 mg daily (2 pumps) daily to shoulder(s) or upper arms(s). Patient not taking: Reported on 01/03/2017 09/23/16   Massie MaroonHollis, Lachina M, FNP    Family History Family History  Problem Relation Age of Onset  . Hypertension Mother   . Hypertension Father   . Diabetes Maternal Grandmother     Social History Social History  Substance Use Topics  . Smoking status: Current Every Day Smoker    Packs/day: 1.00  . Smokeless tobacco: Current User  . Alcohol use 25.2 oz/week    42 Cans of beer per week     Comment: daily     Allergies   Patient has no known allergies.   Review of Systems Review of Systems  Cardiovascular: Positive for palpitations. Negative for chest pain and leg swelling.  All other systems reviewed and are negative.    Physical Exam Updated Vital Signs BP 124/89  Pulse 77   Temp 98.5 F (36.9 C) (Oral)   Resp 16   Ht 6' (1.829 m)   Wt 90.7 kg (200 lb)   SpO2 98%   BMI 27.12 kg/m   Physical Exam  Constitutional: He is oriented to person, place, and time. He appears well-developed and well-nourished. No distress.  HENT:  Head: Normocephalic and atraumatic.  Neck: Neck supple. No JVD present.  Cardiovascular: Normal rate, regular rhythm and normal heart sounds.   No murmur heard. Pulmonary/Chest: Effort normal and breath sounds normal. No respiratory distress. He has no wheezes. He has no rales.  Abdominal: Soft. He exhibits no  distension. There is no tenderness.  Musculoskeletal: He exhibits no edema.  Neurological: He is alert and oriented to person, place, and time.  Skin: Skin is warm and dry.  Nursing note and vitals reviewed.    ED Treatments / Results  Labs (all labs ordered are listed, but only abnormal results are displayed) Labs Reviewed  BASIC METABOLIC PANEL - Abnormal; Notable for the following:       Result Value   CO2 21 (*)    All other components within normal limits  CBC  I-STAT TROPONIN, ED  I-STAT TROPONIN, ED    EKG  EKG Interpretation  Date/Time:  Monday January 03 2017 18:00:09 EDT Ventricular Rate:  95 PR Interval:  148 QRS Duration: 82 QT Interval:  362 QTC Calculation: 454 R Axis:   66 Text Interpretation:  Normal sinus rhythm Normal ECG Baseline wander ECG OTHERWISE WITHIN NORMAL LIMITS Confirmed by Eber Hong ([id number]) on 01/03/2017 8:26:22 PM       Radiology Dg Chest 2 View  Result Date: 01/03/2017 CLINICAL DATA:  Heart racing today. EXAM: CHEST  2 VIEW COMPARISON:  None. FINDINGS: The heart size and mediastinal contours are within normal limits. There is minimal atelectasis of the right mid to the lung base. There is no focal infiltrate, pulmonary edema, or pleural effusion. Minimal degenerative joint changes of the spine are noted. IMPRESSION: No active cardiopulmonary disease. Minimal atelectasis of the right mid to lung base. Electronically Signed   By: Sherian Rein M.D.   On: 01/03/2017 18:36    Procedures Procedures (including critical care time)  Medications Ordered in ED Medications - No data to display   Initial Impression / Assessment and Plan / ED Course  I have reviewed the triage vital signs and the nursing notes.  Pertinent labs & imaging results that were available during my care of the patient were reviewed by me and considered in my medical decision making (see chart for details).    Joshua Blake. is a 46 y.o. male who presents to  ED for palpitations earlier today. Not associated with chest pain or shortness of breath. No syncopal episodes. Upon arrival, HR wdl. Afebrile and hemodynamically stable. Labs reviewed and reassuring with negative troponin x2. CXR negative. EKG reassuring. Patient has been in ED for > 3 hours with no recurrent episodes of symptoms. While in ED on cardiac monitoring, no episodes of tachycardia. PERC negative. Evaluation does not show pathology that would require ongoing emergent intervention or inpatient treatment. Will have patient follow up with cardiology for likely holter monitoring. Discussed reasons to return to ER with patient as well. All questions answered.   Patient discussed with Dr. Clarene Duke who agrees with treatment plan.   Final Clinical Impressions(s) / ED Diagnoses   Final diagnoses:  Palpitations    New Prescriptions Discharge Medication List as of  01/03/2017 10:29 PM       Ward, Chase Picket, PA-C 01/03/17 2312    Little, Ambrose Finland, MD 01/04/17 [phone]

## 2017-01-03 NOTE — ED Triage Notes (Signed)
Pt in c/o heart racing onset today, denies SOB, pt denies n/vd, A&O x4, denies using any type of stimulants today

## 2017-01-26 ENCOUNTER — Ambulatory Visit: Payer: Commercial Managed Care - PPO | Admitting: Physician Assistant

## 2017-02-15 ENCOUNTER — Ambulatory Visit: Payer: Self-pay | Admitting: Physician Assistant

## 2017-02-15 ENCOUNTER — Encounter: Payer: Self-pay | Admitting: Physician Assistant

## 2017-02-15 DIAGNOSIS — R002 Palpitations: Secondary | ICD-10-CM

## 2017-02-15 HISTORY — DX: Palpitations: R00.2

## 2017-02-15 NOTE — Progress Notes (Deleted)
Cardiology Office Note:    Date:  02/15/2017   ID:  Joshua Blake., DOB 06/30/1970, MRN [id number]  PCP:  Massie Maroon, FNP  Cardiologist: Ellis Parents - ***  Referring MD: No ref. provider found   No chief complaint on file. ***  History of Present Illness:    [name]. is a 46 y.o. male with a hx of *** who is being seen today for the evaluation of ***   Mr. Duesing was seen in the emergency room 01/03/17 with palpitations.  Notes indicate his heart rate was measured at 140 bpm at one point prior to presentation.  ECG demonstrated normal sinus rhythm.  The physician assistant in the emergency room requested that he follow-up with cardiology.  ***  No flowsheet data found.  Prior CV studies:   The following studies were reviewed today:  ***  Past Medical History:  Diagnosis Date  . Frequency of urination and polyuria 02/27/2015  . Hypertension   . Libido, decreased 04/22/2016  . Palpitations 02/15/2017  . Testosterone deficiency in male   . Tobacco dependence 04/03/2015    Past Surgical History:  Procedure Laterality Date  . FOOT SURGERY      Current Medications: No outpatient prescriptions have been marked as taking for the 02/15/17 encounter (Appointment) with Tereso Newcomer T, PA-C.     Allergies:   Patient has no known allergies.   Social History   Social History  . Marital status: Married    Spouse name: N/A  . Number of children: N/A  . Years of education: N/A   Social History Main Topics  . Smoking status: Current Every Day Smoker    Packs/day: 1.00  . Smokeless tobacco: Current User  . Alcohol use 25.2 oz/week    42 Cans of beer per week     Comment: daily  . Drug use: No  . Sexual activity: Not on file   Other Topics Concern  . Not on file   Social History Narrative  . No narrative on file     Family Hx: The patient's family history includes Diabetes in his maternal grandmother; Hypertension in his father and  mother.  ROS:   Please see the history of present illness.    ROS All other systems reviewed and are negative.   EKGs/Labs/Other Test Reviewed:    EKG:   ECG from 01/03/17 was personally reviewed: Normal sinus rhythm, heart rate 95, normal axis, QTC 454 ms EKG is *** ordered today.  The ekg ordered today demonstrates ***  Recent Labs: 04/22/2016: TSH 1.59 09/09/2016: ALT 23 01/03/2017: BUN 11; Creatinine, Ser 0.89; Hemoglobin 13.5; Platelets 216; Potassium 4.2; Sodium 138   Recent Lipid Panel Lab Results  Component Value Date/Time   CHOL 180 04/22/2016 08:48 AM   TRIG 103 04/22/2016 08:48 AM   HDL 80 04/22/2016 08:48 AM   CHOLHDL 2.3 04/22/2016 08:48 AM   LDLCALC 79 04/22/2016 08:48 AM    Physical Exam:    VS:  There were no vitals taken for this visit.    Wt Readings from Last 3 Encounters:  01/03/17 200 lb (90.7 kg)  09/14/16 208 lb (94.3 kg)  04/22/16 215 lb (97.5 kg)     ***Physical Exam  ASSESSMENT:    1. Essential hypertension   2. Palpitations    PLAN:    In order of problems listed above:  Essential hypertension  Palpitations***  Dispo:  No Follow-up on file.   Medication Adjustments/Labs and Tests  Ordered: Current medicines are reviewed at length with the patient today.  Concerns regarding medicines are outlined above.  Orders/Tests:  No orders of the defined types were placed in this encounter.  Medication changes: No orders of the defined types were placed in this encounter.  Signed, Tereso Newcomer, PA-C  02/15/2017 8:17 AM    [facility] [address] [place], [place], [place]  [address] Phone: [phone]; Fax: [phone]

## 2017-02-18 ENCOUNTER — Encounter: Payer: Self-pay | Admitting: Physician Assistant

## 2017-02-21 MED FILL — AMLODIPINE BESYLATE 5 MG TA: 5 | 30 days supply | Qty: 30 | Fill #0

## 2017-03-30 ENCOUNTER — Encounter: Payer: Self-pay | Admitting: Family Medicine

## 2017-03-30 ENCOUNTER — Ambulatory Visit (INDEPENDENT_AMBULATORY_CARE_PROVIDER_SITE_OTHER): Payer: Self-pay | Admitting: Family Medicine

## 2017-03-30 VITALS — BP 138/90 | Temp 98.2°F | Resp 16 | Ht 72.0 in | Wt 215.0 lb

## 2017-03-30 DIAGNOSIS — J309 Allergic rhinitis, unspecified: Secondary | ICD-10-CM

## 2017-03-30 DIAGNOSIS — F17200 Nicotine dependence, unspecified, uncomplicated: Secondary | ICD-10-CM

## 2017-03-30 DIAGNOSIS — I10 Essential (primary) hypertension: Secondary | ICD-10-CM

## 2017-03-30 DIAGNOSIS — R220 Localized swelling, mass and lump, head: Secondary | ICD-10-CM

## 2017-03-30 LAB — BASIC METABOLIC PANEL WITH GFR
BUN: 13 mg/dL (ref 7–25)
CALCIUM: 9.3 mg/dL (ref 8.6–10.3)
CHLORIDE: 101 mmol/L (ref 98–110)
CO2: 26 mmol/L (ref 20–32)
Creat: 1.13 mg/dL (ref 0.60–1.35)
GFR, EST AFRICAN AMERICAN: 90 mL/min/{1.73_m2} (ref >=60)
GFR, Est Non African American: 78 mL/min/{1.73_m2} (ref >=60)
GLUCOSE: 93 mg/dL (ref 65–99)
Potassium: 4 mmol/L (ref 3.5–5.3)
Sodium: 135 mmol/L (ref 135–146)

## 2017-03-30 LAB — POCT URINALYSIS DIP (DEVICE)
BILIRUBIN URINE: NEGATIVE
GLUCOSE, UA: NEGATIVE mg/dL
Hgb urine dipstick: NEGATIVE
KETONES UR: NEGATIVE mg/dL
LEUKOCYTES UA: NEGATIVE
Nitrite: NEGATIVE
Protein, ur: NEGATIVE mg/dL
Specific Gravity, Urine: 1.02 (ref 1.005–1.030)
Urobilinogen, UA: 1 mg/dL (ref 0.0–1.0)
pH: 6 (ref 5.0–8.0)

## 2017-03-30 MED ORDER — CETIRIZINE HCL 10 MG PO TABS: 10.0000 mg | ORAL_TABLET | Freq: Every day | ORAL | 1 refills | Status: DC

## 2017-03-30 MED FILL — ?CETIRIZINE HCL 10 MG TABLE: 10 | 30 days supply | Qty: 30 | Fill #0

## 2017-03-30 NOTE — Progress Notes (Signed)
Subjective:    Patient ID: Joshua KlinefelterHarvey Joe Josephs Jr., male    DOB: [birth year], 46 y.o.   MRN: [id number]  HPI   Encarnacion ChuHarvey Joe Blake, a 46 year old male with a history of hypertension presents for 4745-month follow-up.  Patient states that he has been taking amlodipine 5 mg consistently.  Patient states that he did not take medication prior to arrival.  He is not following a low-fat, low-sodium diet or exercise routinely.  Patient continues to smoke half a pack per day.  He is not interested in quitting at this time.  He denies headache, dizziness, shortness of breath, heart palpitations or syncope. Patient states that his wife has noticed mild swelling to his right face.  Swelling was initially identified 3 weeks ago.  He states that he is not changed any lotions, laundry detergent, or body wash.  He has not tried any new foods recently.  He has not attempted any over-the-counter interventions to alleviate current symptoms. Past Medical History:  Diagnosis Date  . Frequency of urination and polyuria 02/27/2015  . Hypertension   . Libido, decreased 04/22/2016  . Palpitations 02/15/2017  . Testosterone deficiency in male   . Tobacco dependence 04/03/2015   There is no immunization history on file for this patient.  Social History   Socioeconomic History  . Marital status: Married    Spouse name: Not on file  . Number of children: Not on file  . Years of education: Not on file  . Highest education level: Not on file  Social Needs  . Financial resource strain: Not on file  . Food insecurity - worry: Not on file  . Food insecurity - inability: Not on file  . Transportation needs - medical: Not on file  . Transportation needs - non-medical: Not on file  Occupational History  . Not on file  Tobacco Use  . Smoking status: Current Every Day Smoker    Packs/day: 1.00  . Smokeless tobacco: Current User  Substance and Sexual Activity  . Alcohol use: Yes    Alcohol/week: 25.2 oz    Types: 42 Cans  of beer per week    Comment: daily  . Drug use: No  . Sexual activity: Not on file  Other Topics Concern  . Not on file  Social History Narrative  . Not on file     Review of Systems  Constitutional: Negative.   HENT: Negative.   Eyes: Negative.   Respiratory: Negative.   Cardiovascular: Negative.  Negative for chest pain, palpitations and leg swelling.  Gastrointestinal: Negative.   Endocrine: Negative.   Genitourinary: Negative.   Musculoskeletal: Negative.   Skin: Negative.        Right facial swelling  Neurological: Negative.   Hematological: Negative.        Objective:   Physical Exam  Constitutional: He is oriented to person, place, and time.  HENT:  Head: Normocephalic and atraumatic.  Right Ear: External ear normal.  Left Ear: External ear normal.  Nose: Nose normal.  Mouth/Throat: Oropharynx is clear and moist.  Eyes: Conjunctivae and EOM are normal. Pupils are equal, round, and reactive to light.  Neck: Normal range of motion. Neck supple.  Pulmonary/Chest: Effort normal and breath sounds normal.  Abdominal: Soft. Bowel sounds are normal.  Neurological: He is alert and oriented to person, place, and time. He has normal reflexes.  Skin: Skin is warm and dry.  Mild erythema to right eyelid  Psychiatric: He has a normal mood and affect.  BP 138/90 Comment: manually  Temp 98.2 F (36.8 C) (Oral)   Resp 16   Ht 6' (1.829 m)   Wt 215 lb (97.5 kg)   SpO2 99%   BMI 29.16 kg/m  Assessment & Plan:  1. Essential hypertension Blood pressure is at goal on current medication regimen, will continue amlodipine 5 mg daily. - Continue medication, monitor blood pressure at home. Continue DASH diet. Reminder to go to the ER if any CP, SOB, nausea, dizziness, severe HA, changes vision/speech, left arm numbness and tingling and jaw pain.    - BASIC METABOLIC PANEL WITH GFR  2. Tobacco dependence Smoking cessation instruction/counseling given:   counseled patient on the dangers of tobacco use, advised patient to stop smoking, and reviewed strategies to maximize success  3. Right facial swelling We will start a trial of cetirizine 10 mg daily at bedtime.   4. Allergic rhinitis, unspecified seasonality, unspecified trigger  - cetirizine (ZYRTEC) 10 MG tablet; Take 1 tablet (10 mg total) by mouth at bedtime.  Dispense: 30 tablet; Refill: 1   RTC: 2 weeks for right facial swelling   Nolon NationsLaChina Moore Sharnee Douglass  MSN, FNP-C Patient Care [facility] [address]  [place], [place] [id number] [phone]

## 2017-03-30 NOTE — Patient Instructions (Signed)
Allergic Rhinitis Allergic rhinitis is when the mucous membranes in the nose respond to allergens. Allergens are particles in the air that cause your body to have an allergic reaction. This causes you to release allergic antibodies. Through a chain of events, these eventually cause you to release histamine into the blood stream. Although meant to protect the body, it is this release of histamine that causes your discomfort, such as frequent sneezing, congestion, and an itchy, runny nose. What are the causes? Seasonal allergic rhinitis (hay fever) is caused by pollen allergens that may come from grasses, trees, and weeds. Year-round allergic rhinitis (perennial allergic rhinitis) is caused by allergens such as house dust mites, pet dander, and mold spores. What are the signs or symptoms?  Nasal stuffiness (congestion).  Itchy, runny nose with sneezing and tearing of the eyes. How is this diagnosed? Your health care provider can help you determine the allergen or allergens that trigger your symptoms. If you and your health care provider are unable to determine the allergen, skin or blood testing may be used. Your health care provider will diagnose your condition after taking your health history and performing a physical exam. Your health care provider may assess you for other related conditions, such as asthma, pink eye, or an ear infection. How is this treated? Allergic rhinitis does not have a cure, but it can be controlled by:  Medicines that block allergy symptoms. These may include allergy shots, nasal sprays, and oral antihistamines.  Avoiding the allergen. Hay fever may often be treated with antihistamines in pill or nasal spray forms. Antihistamines block the effects of histamine. There are over-the-counter medicines that may help with nasal congestion and swelling around the eyes. Check with your health care provider before taking or giving this medicine. If avoiding the allergen or the  medicine prescribed do not work, there are many new medicines your health care provider can prescribe. Stronger medicine may be used if initial measures are ineffective. Desensitizing injections can be used if medicine and avoidance does not work. Desensitization is when a patient is given ongoing shots until the body becomes less sensitive to the allergen. Make sure you follow up with your health care provider if problems continue. Follow these instructions at home: It is not possible to completely avoid allergens, but you can reduce your symptoms by taking steps to limit your exposure to them. It helps to know exactly what you are allergic to so that you can avoid your specific triggers. Contact a health care provider if:  You have a fever.  You develop a cough that does not stop easily (persistent).  You have shortness of breath.  You start wheezing.  Symptoms interfere with normal daily activities. This information is not intended to replace advice given to you by your health care provider. Make sure you discuss any questions you have with your health care provider. Document Released: 01/12/2001 Document Revised: 12/19/2015 Document Reviewed: 12/25/2012 Elsevier Interactive Patient Education  2017 Elsevier Inc.  

## 2017-04-13 ENCOUNTER — Ambulatory Visit: Payer: Self-pay | Admitting: Family Medicine

## 2017-04-13 ENCOUNTER — Ambulatory Visit (INDEPENDENT_AMBULATORY_CARE_PROVIDER_SITE_OTHER): Payer: Self-pay | Admitting: Family Medicine

## 2017-04-13 ENCOUNTER — Encounter: Payer: Self-pay | Admitting: Family Medicine

## 2017-04-13 VITALS — BP 162/92 | Temp 99.3°F | Resp 16 | Ht 72.0 in | Wt 214.0 lb

## 2017-04-13 DIAGNOSIS — H5712 Ocular pain, left eye: Secondary | ICD-10-CM

## 2017-04-13 DIAGNOSIS — H00014 Hordeolum externum left upper eyelid: Secondary | ICD-10-CM

## 2017-04-13 MED ORDER — KETOROLAC TROMETHAMINE 60 MG/2ML IM SOLN
60.0000 mg | Freq: Once | INTRAMUSCULAR | Status: AC
  Administered 2017-04-13: 60 mg via INTRAMUSCULAR

## 2017-04-13 MED ORDER — DOXYCYCLINE HYCLATE 100 MG PO TABS
100.0000 mg | ORAL_TABLET | Freq: Two times a day (BID) | ORAL | 0 refills | Status: AC
Start: 2017-04-13 — End: 2017-04-23

## 2017-04-13 MED ORDER — IBUPROFEN 600 MG PO TABS: 600.0000 mg | ORAL_TABLET | Freq: Three times a day (TID) | ORAL | 0 refills | Status: DC | PRN

## 2017-04-13 MED FILL — IBUPROFEN 600 MG TABLET: 600 | 10 days supply | Qty: 30 | Fill #0

## 2017-04-13 MED FILL — ?DOXYCYCLINE 100MG TABLET: 100 | 10 days supply | Qty: 20 | Fill #0

## 2017-04-13 NOTE — Patient Instructions (Addendum)
Clean twice daily with Johnson's baby shampoo. Please do not attempt to drain! The hordeolum should not be expressed.  Apply warm moist compresses to affected area, which should help promote draining.  Practice good personal hygiene with attention to cleaning the eyelids with Johnson's baby shampoo twice daily to prevent recurrent infections.     Stye A stye is a bump on your eyelid caused by a bacterial infection. A stye can form inside the eyelid (internal stye) or outside the eyelid (external stye). An internal stye may be caused by an infected oil-producing gland inside your eyelid. An external stye may be caused by an infection at the base of your eyelash (hair follicle). Styes are very common. Anyone can get them at any age. They usually occur in just one eye, but you may have more than one in either eye. What are the causes? The infection is almost always caused by bacteria called Staphylococcus aureus. This is a common type of bacteria that lives on your skin. What increases the risk? You may be at higher risk for a stye if you have had one before. You may also be at higher risk if you have:  Diabetes.  Long-term illness.  Long-term eye redness.  A skin condition called seborrhea.  High fat levels in your blood (lipids).  What are the signs or symptoms? Eyelid pain is the most common symptom of a stye. Internal styes are more painful than external styes. Other signs and symptoms may include:  Painful swelling of your eyelid.  A scratchy feeling in your eye.  Tearing and redness of your eye.  Pus draining from the stye.  How is this diagnosed? Your health care provider may be able to diagnose a stye just by examining your eye. The health care provider may also check to make sure:  You do not have a fever or other signs of a more serious infection.  The infection has not spread to other parts of your eye or areas around your eye.  How is this treated? Most styes will  clear up in a few days without treatment. In some cases, you may need to use antibiotic drops or ointment to prevent infection. Your health care provider may have to drain the stye surgically if your stye is:  Large.  Causing a lot of pain.  Interfering with your vision.  This can be done using a thin blade or a needle. Follow these instructions at home:  Take medicines only as directed by your health care provider.  Apply a clean, warm compress to your eye for 10 minutes, 4 times a day.  Do not wear contact lenses or eye makeup until your stye has healed.  Do not try to pop or drain the stye. Contact a health care provider if:  You have chills or a fever.  Your stye does not go away after several days.  Your stye affects your vision.  Your eyeball becomes swollen, red, or painful. This information is not intended to replace advice given to you by your health care provider. Make sure you discuss any questions you have with your health care provider. Document Released: 01/27/2005 Document Revised: 12/14/2015 Document Reviewed: 08/03/2013 Elsevier Interactive Patient Education  [employer]2018 Elsevier Inc.

## 2017-04-13 NOTE — Progress Notes (Signed)
Chief Complaint  Patient presents with  . Eye Problem    sty in left eye     Subjective:    Patient ID: Joshua KlinefelterHarvey Joe Barkett Jr., male    DOB: 12/08/1970, 46 y.o.   MRN: [id number]  HPI Joshua Blake, a 46 year old male presents complaining of swelling over the left eyelid for greater than a week.  Patient describes the inflammation as a stye.  He has been applying warm moist compresses with minimal relief.  He describes diet as tight and painful.  He attributes current stye to wearing safety glasses at work.  Current pain intensity is 2-3 out of 10 described as intermittent and tender.  He denies eye itching, eye drainage, photophobia, or blurred vision. Past Medical History:  Diagnosis Date  . Frequency of urination and polyuria 02/27/2015  . Hypertension   . Libido, decreased 04/22/2016  . Palpitations 02/15/2017  . Testosterone deficiency in male   . Tobacco dependence 04/03/2015   There is no immunization history on file for this patient.  Social History   Socioeconomic History  . Marital status: Married    Spouse name: Not on file  . Number of children: Not on file  . Years of education: Not on file  . Highest education level: Not on file  Social Needs  . Financial resource strain: Not on file  . Food insecurity - worry: Not on file  . Food insecurity - inability: Not on file  . Transportation needs - medical: Not on file  . Transportation needs - non-medical: Not on file  Occupational History  . Not on file  Tobacco Use  . Smoking status: Current Every Day Smoker    Packs/day: 1.00  . Smokeless tobacco: Current User  Substance and Sexual Activity  . Alcohol use: Yes    Alcohol/week: 25.2 oz    Types: 42 Cans of beer per week    Comment: daily  . Drug use: No  . Sexual activity: Not on file  Other Topics Concern  . Not on file  Social History Narrative  . Not on file    Review of Systems  Constitutional: Negative.   Eyes: Positive for pain and redness.  Negative for discharge, itching and visual disturbance.  Respiratory: Negative.   Cardiovascular: Negative.   Gastrointestinal: Negative.   Endocrine: Negative.   Genitourinary: Negative.   Musculoskeletal: Negative.   Allergic/Immunologic: Negative.   Neurological: Negative.   Hematological: Negative.   Psychiatric/Behavioral: Negative.        Objective:   Physical Exam  Constitutional: He is oriented to person, place, and time. He appears well-developed and well-nourished.  HENT:  Head: Normocephalic.  Right Ear: External ear normal.  Eyes:    Cardiovascular: Normal rate, regular rhythm, normal heart sounds and intact distal pulses.  Pulmonary/Chest: Effort normal and breath sounds normal.  Abdominal: Soft. Bowel sounds are normal.  Neurological: He is alert and oriented to person, place, and time.      BP (!) 162/92 (BP Location: Left Arm, Patient Position: Sitting, Cuff Size: Large) Comment: manually  Temp 99.3 F (37.4 C) (Oral)   Resp 16   Ht 6' (1.829 m)   Wt 214 lb (97.1 kg)   SpO2 99%   BMI 29.02 kg/m     Assessment & Plan:  Hordeolum externum of left upper eyelid Reminded patient that the hordeolum should not be expressed. He is to apply warm moist compresses to left eye several times per day to encourage  spontaneous drainage. Practice good personal hygiene with attention to cleansing the eyelid daily with Johnson's baby shampoo. - doxycycline (VIBRA-TABS) 100 MG tablet; Take 1 tablet (100 mg total) by mouth 2 (two) times daily for 10 days.  Dispense: 20 tablet; Refill: 0  Left eye pain - ketorolac (TORADOL) injection 60 mg - ibuprofen (ADVIL,MOTRIN) 600 MG tablet; Take 1 tablet (600 mg total) by mouth every 8 (eight) hours as needed.  Dispense: 30 tablet; Refill: 0   RTC: We will follow-up in 2 weeks for left upper eyelid hordeolum   Nolon NationsLachina Moore Arion Morgan  MSN, FNP-C Patient Care [facility] [address]  [place],  [place] [id number] [phone]

## 2017-04-27 ENCOUNTER — Ambulatory Visit: Payer: Self-pay | Admitting: Family Medicine

## 2017-07-13 ENCOUNTER — Ambulatory Visit: Payer: Self-pay | Admitting: Family Medicine

## 2017-07-20 ENCOUNTER — Ambulatory Visit: Payer: Self-pay | Admitting: Family Medicine

## 2018-01-28 ENCOUNTER — Encounter (HOSPITAL_COMMUNITY): Payer: Self-pay | Admitting: Emergency Medicine

## 2018-01-28 ENCOUNTER — Emergency Department (HOSPITAL_COMMUNITY)
Admission: EM | Admit: 2018-01-28 | Discharge: 2018-01-28 | Disposition: A | Discharge status: Home / Self Care | Payer: Commercial Managed Care - PPO | Attending: Emergency Medicine | Admitting: Emergency Medicine

## 2018-01-28 DIAGNOSIS — F17210 Nicotine dependence, cigarettes, uncomplicated: Secondary | ICD-10-CM | POA: Insufficient documentation

## 2018-01-28 DIAGNOSIS — L84 Corns and callosities: Secondary | ICD-10-CM | POA: Diagnosis not present

## 2018-01-28 DIAGNOSIS — Z79899 Other long term (current) drug therapy: Secondary | ICD-10-CM | POA: Insufficient documentation

## 2018-01-28 DIAGNOSIS — I10 Essential (primary) hypertension: Secondary | ICD-10-CM | POA: Insufficient documentation

## 2018-01-28 DIAGNOSIS — M79671 Pain in right foot: Secondary | ICD-10-CM | POA: Diagnosis present

## 2018-01-28 MED ORDER — NAPROXEN 500 MG PO TABS: 500.0000 mg | ORAL_TABLET | Freq: Two times a day (BID) | ORAL | 0 refills | Status: DC

## 2018-01-28 MED ORDER — IBUPROFEN 600 MG PO TABS: 600.0000 mg | ORAL_TABLET | Freq: Four times a day (QID) | ORAL | 0 refills | Status: DC | PRN

## 2018-01-28 MED ORDER — ACETAMINOPHEN 500 MG PO TABS: 1000.0000 mg | ORAL_TABLET | Freq: Three times a day (TID) | ORAL | 0 refills | Status: DC | PRN

## 2018-01-28 NOTE — ED Notes (Signed)
Patient verbalizes understanding of discharge instructions. Opportunity for questioning and answers were provided. Armband removed by staff, pt discharged from ED. Pt to take BP meds when he gets home

## 2018-01-28 NOTE — Discharge Instructions (Addendum)
Please follow-up with Dr. Logan Bores as soon as possible for further evaluation and treatment of your foot problems.  You can alternate naprosyn and Tylenol as prescribed for your pain.  You can use warm Epsom salt baths.  Please return the emergency department if you develop any increasing pain, redness, drainage, swelling, red streaking from the area, or fevers.  I recommend avoiding the fungal medication until evaluated by the podiatrist if it increased your pain.

## 2018-01-28 NOTE — ED Triage Notes (Signed)
Pt reports swelling and pain to 3rd toe on R foot x3 days. Pt has been using fungus medication on toes. States that there is dark callous under all of the nails.
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
## 2018-01-30 ENCOUNTER — Ambulatory Visit: Payer: Self-pay

## 2018-01-30 ENCOUNTER — Encounter: Payer: Self-pay | Admitting: Podiatry

## 2018-01-30 ENCOUNTER — Ambulatory Visit (INDEPENDENT_AMBULATORY_CARE_PROVIDER_SITE_OTHER): Payer: Commercial Managed Care - PPO | Admitting: Podiatry

## 2018-01-30 DIAGNOSIS — M21611 Bunion of right foot: Secondary | ICD-10-CM

## 2018-01-30 DIAGNOSIS — M898X9 Other specified disorders of bone, unspecified site: Secondary | ICD-10-CM

## 2018-01-30 DIAGNOSIS — M21612 Bunion of left foot: Principal | ICD-10-CM

## 2018-01-30 DIAGNOSIS — M2041 Other hammer toe(s) (acquired), right foot: Secondary | ICD-10-CM

## 2018-01-30 NOTE — Progress Notes (Signed)
Subjective: 47 year old otherwise healthy male presents the office today for evaluation of bunions and hammertoes the bilateral feet.  Patient gets symptomatic callus lesion secondary to his hammertoe and bunion deformities.  He is tried multiple conservative modalities including trimming the callus lesions but this only gives him a few days of relief.  He is also tried modifying his shoe gear without any relief.  He is also been seen by other physicians and he is currently ready for surgical intervention to alleviate his bunion and hammertoe deformities. Patient also has a history of osteomyelitis although he is nondiabetic to the fifth metatarsal heads bilateral feet.  He subsequently developed a pre-ulcerative callus lesion to the plantar aspect of the fifth metatarsal left foot secondary to hypertrophic bony exostosis at the osteotomy site.  Surgery was approximately 5 years ago.    Past Medical History:  Diagnosis Date  . Frequency of urination and polyuria 02/27/2015  . Hypertension   . Libido, decreased 04/22/2016  . Palpitations 02/15/2017  . Testosterone deficiency in male   . Tobacco dependence 04/03/2015    Objective: Physical Exam General: The patient is alert and oriented x3 in no acute distress.  Dermatology: Hyperkeratotic callus lesions also noted to the distal tuft of the third digit right foot, medial aspect of the right hallux, and sub-fifth MPJ left foot.  Skin is cool, dry and supple bilateral lower extremities. Negative for open lesions or macerations.  Vascular: Palpable pedal pulses bilaterally. No edema or erythema noted. Capillary refill within normal limits.  Neurological: Epicritic and protective threshold grossly intact bilaterally.   Musculoskeletal Exam: Clinical evidence of bunion deformity noted to the respective foot. There is a moderate pain on palpation range of motion of the first MPJ. Lateral deviation of the hallux noted consistent with hallux  abductovalgus. Hammertoe contracture also noted on clinical exam to digits 2-4 of the right foot. Symptomatic pain on palpation and range of motion also noted to the metatarsal phalangeal joints of the respective hammertoe digits.    Radiographic Exam: Increased intermetatarsal angle greater than 15 with a hallux abductus angle greater than 30 noted on AP view. Moderate degenerative changes noted within the first MPJ. Contracture deformity also noted to the interphalangeal joints and MPJs of the digits of the respective hammertoes.    Assessment: 1. HAV w/ bunion deformity right 2. Hammertoe deformity 2-4 right 3.  Bony exostosis fifth metatarsal left 4.  Hyperkeratotic callus lesions bilateral feet   Plan of Care:  1. Patient was evaluated. X-Rays reviewed. 2. Today we discussed the conservative versus surgical management of the presenting pathology. The patient opts for surgical management. All possible complications and details of the procedure were explained. All patient questions were answered. No guarantees were expressed or implied. 3. Authorization for surgery was initiated today. Surgery will consist of bunionectomy with metatarsal osteotomy right foot.  PIPJ arthroplasty with MTPJ capsulotomy digits 2-4 right.  Fifth metatarsal head resection left. 4.  Return to clinic 1 week postop     Felecia Shelling, DPM [facility]  Dr. Felecia Shelling, DPM    [address]                                        [place], [place] [address]                Office [phone]  Fax [phone]

## 2018-01-30 NOTE — Patient Instructions (Signed)
Pre-Operative Instructions  Congratulations, you have decided to take an important step towards improving your quality of life.  You can be assured that the doctors and staff at [facility] will be with you every step of the way.  Here are some important things you should know:  1. Plan to be at the surgery center/hospital at least 1 (one) hour prior to your scheduled time, unless otherwise directed by the surgical center/hospital staff.  You must have a responsible adult accompany you, remain during the surgery and drive you home.  Make sure you have directions to the surgical center/hospital to ensure you arrive on time. 2. If you are having surgery at [facility] or [facility], you will need a copy of your medical history and physical form from your family physician within one month prior to the date of surgery. We will give you a form for your primary physician to complete.  3. We make every effort to accommodate the date you request for surgery.  However, there are times where surgery dates or times have to be moved.  We will contact you as soon as possible if a change in schedule is required.   4. No aspirin/ibuprofen for one week before surgery.  If you are on aspirin, any non-steroidal anti-inflammatory medications (Mobic, Aleve, Ibuprofen) should not be taken seven (7) days prior to your surgery.  You make take Tylenol for pain prior to surgery.  5. Medications - If you are taking daily heart and blood pressure medications, seizure, reflux, allergy, asthma, anxiety, pain or diabetes medications, make sure you notify the surgery center/hospital before the day of surgery so they can tell you which medications you should take or avoid the day of surgery. 6. No food or drink after midnight the night before surgery unless directed otherwise by surgical center/hospital staff. 7. No alcoholic beverages 24-hours prior to surgery.  No smoking 24-hours prior or 24-hours after  surgery. 8. Wear loose pants or shorts. They should be loose enough to fit over bandages, boots, and casts. 9. Don't wear slip-on shoes. Sneakers are preferred. 10. Bring your boot with you to the surgery center/hospital.  Also bring crutches or a walker if your physician has prescribed it for you.  If you do not have this equipment, it will be provided for you after surgery. 11. If you have not been contacted by the surgery center/hospital by the day before your surgery, call to confirm the date and time of your surgery. 12. Leave-time from work may vary depending on the type of surgery you have.  Appropriate arrangements should be made prior to surgery with your employer. 13. Prescriptions will be provided immediately following surgery by your doctor.  Fill these as soon as possible after surgery and take the medication as directed. Pain medications will not be refilled on weekends and must be approved by the doctor. 14. Remove nail polish on the operative foot and avoid getting pedicures prior to surgery. 15. Wash the night before surgery.  The night before surgery wash the foot and leg well with water and the antibacterial soap provided. Be sure to pay special attention to beneath the toenails and in between the toes.  Wash for at least three (3) minutes. Rinse thoroughly with water and dry well with a towel.  Perform this wash unless told not to do so by your physician.  Enclosed: 1 Ice pack (please put in freezer the night before surgery)   1 Hibiclens skin cleaner     Pre-op instructions  If you have any questions regarding the instructions, please do not hesitate to call our office.  [place]: [address], [place], [place] [address] -- [phone]  [place]: [address] [place], [place] [address] -- [phone]  [place]: [address]  [place], [place] [address] -- [phone]  [place]: [address], [place], [place] [address] -- [phone]  Website:  [email or web address] 

## 2018-02-20 ENCOUNTER — Telehealth: Payer: Self-pay | Admitting: *Deleted

## 2018-02-20 NOTE — Telephone Encounter (Signed)
"  I need to reschedule my surgery.  I need to cancel my surgery for the 24th and reschedule it.  Can you please give me a call so we can reschedule?  Thank you."

## 2018-02-21 NOTE — Telephone Encounter (Signed)
I attempted to call the patient to reschedule his surgery.  I left him a message to call me tomorrow.

## 2018-02-22 NOTE — Telephone Encounter (Signed)
"  I'm calling to reschedule my husband's surgery.  We had to cancel it because we have to make financial arrangements. Can he do it next month?"  He can do it on November 14.  "That date will be fine.  What time will he need to be there?"  Someone from the surgical center will give him a call a day or two prior to his surgery date.  They will give him his arrival time.  "I need to speak to someone in billing.  I need to set up payment arrangements."  I'll transfer you to Chip Boer, who works in [employer].  The call was transferred to Nelly Laurence.  I rescheduled the patient through the surgical center's [employer].

## 2018-02-22 NOTE — Telephone Encounter (Signed)
I will reschedule postop appointments to reflect new surgical date.

## 2018-02-23 DIAGNOSIS — M79676 Pain in unspecified toe(s): Secondary | ICD-10-CM

## 2018-03-01 ENCOUNTER — Encounter: Payer: Commercial Managed Care - PPO | Admitting: Podiatry

## 2018-03-13 ENCOUNTER — Encounter: Payer: Commercial Managed Care - PPO | Admitting: Podiatry

## 2018-03-16 ENCOUNTER — Encounter: Payer: Self-pay | Admitting: Podiatry

## 2018-03-16 ENCOUNTER — Other Ambulatory Visit: Payer: Self-pay | Admitting: Podiatry

## 2018-03-16 DIAGNOSIS — M21622 Bunionette of left foot: Secondary | ICD-10-CM | POA: Diagnosis not present

## 2018-03-16 DIAGNOSIS — M2011 Hallux valgus (acquired), right foot: Secondary | ICD-10-CM | POA: Diagnosis not present

## 2018-03-16 DIAGNOSIS — M21542 Acquired clubfoot, left foot: Secondary | ICD-10-CM | POA: Diagnosis not present

## 2018-03-16 DIAGNOSIS — M2041 Other hammer toe(s) (acquired), right foot: Secondary | ICD-10-CM | POA: Diagnosis not present

## 2018-03-16 DIAGNOSIS — M898X7 Other specified disorders of bone, ankle and foot: Secondary | ICD-10-CM | POA: Diagnosis not present

## 2018-03-16 DIAGNOSIS — M21611 Bunion of right foot: Secondary | ICD-10-CM | POA: Diagnosis not present

## 2018-03-16 DIAGNOSIS — M7751 Other enthesopathy of right foot: Secondary | ICD-10-CM | POA: Diagnosis not present

## 2018-03-16 MED ORDER — AMOXICILLIN-POT CLAVULANATE 875-125 MG PO TABS: 1.0000 | ORAL_TABLET | Freq: Two times a day (BID) | ORAL | 0 refills | Status: DC

## 2018-03-16 MED ORDER — MELOXICAM 15 MG PO TABS: 15.0000 mg | ORAL_TABLET | Freq: Every day | ORAL | 0 refills | Status: AC

## 2018-03-16 MED ORDER — OXYCODONE-ACETAMINOPHEN 5-325 MG PO TABS: 1.0000 | ORAL_TABLET | Freq: Four times a day (QID) | ORAL | 0 refills | Status: DC | PRN

## 2018-03-16 NOTE — Progress Notes (Signed)
Postop medication

## 2018-03-22 ENCOUNTER — Ambulatory Visit (INDEPENDENT_AMBULATORY_CARE_PROVIDER_SITE_OTHER): Payer: Commercial Managed Care - PPO

## 2018-03-22 ENCOUNTER — Other Ambulatory Visit: Payer: Self-pay | Admitting: Family Medicine

## 2018-03-22 ENCOUNTER — Encounter: Payer: Self-pay | Admitting: Podiatry

## 2018-03-22 ENCOUNTER — Ambulatory Visit (INDEPENDENT_AMBULATORY_CARE_PROVIDER_SITE_OTHER): Payer: Commercial Managed Care - PPO | Admitting: Podiatry

## 2018-03-22 ENCOUNTER — Telehealth: Payer: Self-pay | Admitting: Podiatry

## 2018-03-22 DIAGNOSIS — I10 Essential (primary) hypertension: Secondary | ICD-10-CM

## 2018-03-22 DIAGNOSIS — M2041 Other hammer toe(s) (acquired), right foot: Secondary | ICD-10-CM

## 2018-03-22 DIAGNOSIS — M79672 Pain in left foot: Secondary | ICD-10-CM

## 2018-03-22 DIAGNOSIS — M79671 Pain in right foot: Secondary | ICD-10-CM

## 2018-03-22 DIAGNOSIS — M2042 Other hammer toe(s) (acquired), left foot: Secondary | ICD-10-CM

## 2018-03-22 DIAGNOSIS — Z9889 Other specified postprocedural states: Secondary | ICD-10-CM

## 2018-03-22 MED ORDER — OXYCODONE-ACETAMINOPHEN 5-325 MG PO TABS: 1.0000 | ORAL_TABLET | Freq: Four times a day (QID) | ORAL | 0 refills | Status: DC | PRN

## 2018-03-22 NOTE — Telephone Encounter (Signed)
Pharmacy called to inform us they are not able to fill the patients prescription for Percocet. They are going to void the prescription but wanted to make us aware so we could send it to another pharmacy.

## 2018-03-23 ENCOUNTER — Other Ambulatory Visit: Payer: Self-pay | Admitting: Podiatry

## 2018-03-23 DIAGNOSIS — M2042 Other hammer toe(s) (acquired), left foot: Principal | ICD-10-CM

## 2018-03-23 DIAGNOSIS — M2041 Other hammer toe(s) (acquired), right foot: Secondary | ICD-10-CM

## 2018-03-23 MED ORDER — OXYCODONE-ACETAMINOPHEN 5-325 MG PO TABS: 1.0000 | ORAL_TABLET | Freq: Four times a day (QID) | ORAL | 0 refills | Status: DC | PRN

## 2018-03-23 NOTE — Telephone Encounter (Signed)
I called pt and he stated we could [facility] [address]

## 2018-03-23 NOTE — Progress Notes (Signed)
Refilled percocet as prescribed by Tommi Rumpse. Logan BoresEvans

## 2018-03-23 NOTE — Telephone Encounter (Signed)
done

## 2018-03-27 NOTE — Progress Notes (Signed)
   Subjective:  Patient presents today status post right forefoot surgery and left 5th metatarsal head resection. DOS: 03/16/18. He reports moderate pain today. He has been taking Percocet for treatment. There are no modifying factors noted. Patient is here for further evaluation and treatment.    Past Medical History:  Diagnosis Date  . Frequency of urination and polyuria 02/27/2015  . Hypertension   . Libido, decreased 04/22/2016  . Palpitations 02/15/2017  . Testosterone deficiency in male   . Tobacco dependence 04/03/2015      Objective/Physical Exam Neurovascular status intact.  Skin incisions appear to be well coapted with sutures and staples intact. No sign of infectious process noted. No dehiscence. No active bleeding noted. Moderate edema noted to the surgical extremity.  Radiographic Exam:  Orthopedic hardware and osteotomies sites appear to be stable with routine healing.  Assessment: 1. s/p right forefoot surgery and left 5th metatarsal head resection. DOS: 03/16/18   Plan of Care:  1. Patient was evaluated. X-rays reviewed 2. Dressing changed. Keep clean, dry and intact for one week.  3. Refill prescription for Percocet 5/325 mg provided to patient.  4. Continue weightbearing in CAM boot on the right and post op shoe on the left.  5. Return to clinic in one week.    Felecia ShellingBrent M. Creta Dorame, DPM [facility]  Dr. Felecia ShellingBrent M. Kellina Dreese, DPM    [address]                                        [place], [place] [address]                Office [phone]  Fax [phone]

## 2018-03-28 MED FILL — NAPROXEN 500 MG TABLET: 500 | 15 days supply | Qty: 30 | Fill #0

## 2018-03-29 ENCOUNTER — Ambulatory Visit (INDEPENDENT_AMBULATORY_CARE_PROVIDER_SITE_OTHER): Payer: Commercial Managed Care - PPO | Admitting: Podiatry

## 2018-03-29 DIAGNOSIS — M79672 Pain in left foot: Secondary | ICD-10-CM

## 2018-03-29 DIAGNOSIS — M2041 Other hammer toe(s) (acquired), right foot: Secondary | ICD-10-CM

## 2018-03-29 DIAGNOSIS — M79671 Pain in right foot: Secondary | ICD-10-CM

## 2018-03-29 DIAGNOSIS — Z9889 Other specified postprocedural states: Secondary | ICD-10-CM

## 2018-03-29 MED ORDER — TRAMADOL HCL 50 MG PO TABS: 50.0000 mg | ORAL_TABLET | Freq: Three times a day (TID) | ORAL | 0 refills | Status: DC | PRN

## 2018-04-02 NOTE — Progress Notes (Signed)
   Subjective:  Patient presents today status post right forefoot surgery and left 5th metatarsal head resection. DOS: 03/16/18. He reports some intermittent sharp pain to the right 2nd, 3rd and 4th toes as well as to the stapes of the right foot. There are no modifying factors noted. He has been using the CAM boot as directed.  He denies any pain of the left foot and states it is healing well. He denies any modifying factors and has been using the post op shoe as directed. Patient is here for further evaluation and treatment.    Past Medical History:  Diagnosis Date  . Frequency of urination and polyuria 02/27/2015  . Hypertension   . Libido, decreased 04/22/2016  . Palpitations 02/15/2017  . Testosterone deficiency in male   . Tobacco dependence 04/03/2015      Objective/Physical Exam Neurovascular status intact.  Skin incisions appear to be well coapted with sutures and staples intact. No sign of infectious process noted. No dehiscence. No active bleeding noted. Moderate edema noted to the surgical extremity.  Assessment: 1. s/p right forefoot surgery and left 5th metatarsal head resection. DOS: 03/16/18   Plan of Care:  1. Patient was evaluated.  2. Partial staples removed.  3. Dry sterile dressing applied. Keep clean, dry and intact for two weeks.  4. Continue using CAM boot right and post op shoe left.  5. Prescription for Tramadol 50 mg #60 provided to patient.  6. Return to clinic in 2 weeks for remaining staple removal, percutaneous pin removal and X-Ray.    Felecia ShellingBrent M. Anikin Prosser, DPM [facility]  Dr. Felecia ShellingBrent M. Varetta Chavers, DPM    [address]                                        [place], [place] [address]                Office [phone]  Fax [phone]) [phone]

## 2018-04-13 NOTE — Progress Notes (Signed)
DOS  03/16/2018  Bunionectomy right foot. Hammer toe repair digits two, three and four right. Fifth metatarsal head resection left.

## 2018-04-17 ENCOUNTER — Ambulatory Visit (INDEPENDENT_AMBULATORY_CARE_PROVIDER_SITE_OTHER): Payer: Commercial Managed Care - PPO | Admitting: Podiatry

## 2018-04-17 ENCOUNTER — Encounter: Payer: Self-pay | Admitting: Podiatry

## 2018-04-17 ENCOUNTER — Ambulatory Visit (INDEPENDENT_AMBULATORY_CARE_PROVIDER_SITE_OTHER): Payer: Commercial Managed Care - PPO

## 2018-04-17 DIAGNOSIS — Z9889 Other specified postprocedural states: Secondary | ICD-10-CM

## 2018-04-17 DIAGNOSIS — M79671 Pain in right foot: Secondary | ICD-10-CM

## 2018-04-17 DIAGNOSIS — M2041 Other hammer toe(s) (acquired), right foot: Secondary | ICD-10-CM

## 2018-04-17 DIAGNOSIS — M79672 Pain in left foot: Secondary | ICD-10-CM

## 2018-04-17 DIAGNOSIS — M21611 Bunion of right foot: Secondary | ICD-10-CM

## 2018-04-20 NOTE — Progress Notes (Signed)
   Subjective:  Patient presents today status post right forefoot surgery and left 5th metatarsal head resection. DOS: 03/16/18. He states he is doing well overall. He reports some mild pain to the medial aspect of the right foot. He expresses concern about a sore between the 3rd and 4th metatarsal with associated malodor. He has been weightbearing in the CAM boot on the right foot and post op shoe on the left. Patient is here for further evaluation and treatment.    Past Medical History:  Diagnosis Date  . Frequency of urination and polyuria 02/27/2015  . Hypertension   . Libido, decreased 04/22/2016  . Palpitations 02/15/2017  . Testosterone deficiency in male   . Tobacco dependence 04/03/2015      Objective/Physical Exam Neurovascular status intact.  Skin incisions appear to be well coapted with sutures and staples intact. No sign of infectious process noted. No dehiscence. No active bleeding noted. Moderate edema noted to the surgical extremity.  Radiographic Exam:  Orthopedic hardware and osteotomies sites appear to be stable with routine healing.  Assessment: 1. s/p right forefoot surgery and left 5th metatarsal head resection. DOS: 03/16/18   Plan of Care:  1. Patient was evaluated. X-Rays reviewed.  2. Remaining staples removed.  3. Continue weightbearing in CAM boot on the right foot and good shoes on the left.  4. Return to clinic in 2 weeks for percutaneous pin removal.    Felecia ShellingBrent M. Runa Whittingham, DPM [facility]  Dr. Felecia ShellingBrent M. Lorann Tani, DPM    [address]                                        [place], [place] [address]                Office [phone]  Fax [phone]

## 2018-05-01 ENCOUNTER — Ambulatory Visit (INDEPENDENT_AMBULATORY_CARE_PROVIDER_SITE_OTHER): Payer: Commercial Managed Care - PPO

## 2018-05-01 ENCOUNTER — Ambulatory Visit (INDEPENDENT_AMBULATORY_CARE_PROVIDER_SITE_OTHER): Payer: Commercial Managed Care - PPO | Admitting: Podiatry

## 2018-05-01 DIAGNOSIS — M21611 Bunion of right foot: Secondary | ICD-10-CM

## 2018-05-01 DIAGNOSIS — M79672 Pain in left foot: Secondary | ICD-10-CM

## 2018-05-01 DIAGNOSIS — M2041 Other hammer toe(s) (acquired), right foot: Secondary | ICD-10-CM

## 2018-05-01 DIAGNOSIS — M2042 Other hammer toe(s) (acquired), left foot: Secondary | ICD-10-CM | POA: Diagnosis not present

## 2018-05-01 DIAGNOSIS — M79671 Pain in right foot: Secondary | ICD-10-CM

## 2018-05-01 DIAGNOSIS — Z9889 Other specified postprocedural states: Secondary | ICD-10-CM

## 2018-05-01 MED ORDER — TRAMADOL HCL 50 MG PO TABS: 50.0000 mg | ORAL_TABLET | Freq: Three times a day (TID) | ORAL | 0 refills | Status: DC | PRN

## 2018-05-01 NOTE — Progress Notes (Signed)
Post op pain 

## 2018-05-02 ENCOUNTER — Other Ambulatory Visit: Payer: Self-pay | Admitting: Podiatry

## 2018-05-02 DIAGNOSIS — M2042 Other hammer toe(s) (acquired), left foot: Secondary | ICD-10-CM

## 2018-05-02 DIAGNOSIS — M2041 Other hammer toe(s) (acquired), right foot: Secondary | ICD-10-CM

## 2018-05-24 ENCOUNTER — Ambulatory Visit (INDEPENDENT_AMBULATORY_CARE_PROVIDER_SITE_OTHER): Payer: Commercial Managed Care - PPO | Admitting: Podiatry

## 2018-05-24 ENCOUNTER — Encounter: Payer: Self-pay | Admitting: Podiatry

## 2018-05-24 ENCOUNTER — Ambulatory Visit (INDEPENDENT_AMBULATORY_CARE_PROVIDER_SITE_OTHER): Payer: Commercial Managed Care - PPO

## 2018-05-24 DIAGNOSIS — Z9889 Other specified postprocedural states: Secondary | ICD-10-CM

## 2018-05-24 DIAGNOSIS — M79671 Pain in right foot: Secondary | ICD-10-CM

## 2018-05-24 DIAGNOSIS — M2041 Other hammer toe(s) (acquired), right foot: Secondary | ICD-10-CM | POA: Diagnosis not present

## 2018-05-29 ENCOUNTER — Encounter: Payer: Commercial Managed Care - PPO | Admitting: Podiatry

## 2018-05-29 ENCOUNTER — Telehealth: Payer: Self-pay | Admitting: Podiatry

## 2018-05-29 NOTE — Telephone Encounter (Signed)
Pt states he did well on the pain medication Dr. Logan Bores wrote previously, Aleve and he would like a refill, it didn't make him groggy.

## 2018-05-29 NOTE — Telephone Encounter (Signed)
I need to speak to someone about my prescription.

## 2018-05-30 ENCOUNTER — Other Ambulatory Visit: Payer: Self-pay | Admitting: Podiatry

## 2018-05-30 MED ORDER — TRAMADOL HCL 50 MG PO TABS: 50.0000 mg | ORAL_TABLET | Freq: Three times a day (TID) | ORAL | 0 refills | Status: DC | PRN

## 2018-05-30 NOTE — Telephone Encounter (Signed)
It was under medications. Tramadol was prescribed on 05/01/18. Refill sent

## 2018-05-30 NOTE — Progress Notes (Signed)
Post op pain 

## 2018-06-04 NOTE — Progress Notes (Signed)
   Subjective:  Patient presents today status post right forefoot surgery and left 5th metatarsal head resection. DOS: 03/16/18. He states he is doing well overall. He states the pain has improved and the swelling is minimal at this point. He denies any new complaints and states he is ready to return to work. Patient is here for further evaluation and treatment.     Past Medical History:  Diagnosis Date  . Frequency of urination and polyuria 02/27/2015  . Hypertension   . Libido, decreased 04/22/2016  . Palpitations 02/15/2017  . Testosterone deficiency in male   . Tobacco dependence 04/03/2015      Objective/Physical Exam Neurovascular status intact.  Skin incisions appear to be well coapted. No sign of infectious process noted. No dehiscence. No active bleeding noted. Moderate edema noted to the surgical extremity.  Assessment: 1. s/p right forefoot surgery and left 5th metatarsal head resection. DOS: 03/16/18   Plan of Care:  1. Patient was evaluated.  2. Today we will extend the patient's FMLA an additional four weeks.  3. Recommended good shoe gear. Discontinue using post op shoe.  4. Return to clinic in 4 weeks.    Felecia Shelling, DPM [facility]  Dr. Felecia Shelling, DPM    [address]                                        [place], [place] [address]                Office [phone]  Fax [phone]

## 2018-06-21 ENCOUNTER — Encounter: Payer: Self-pay | Admitting: Podiatry

## 2018-06-28 ENCOUNTER — Encounter: Payer: Self-pay | Admitting: Podiatry

## 2018-06-28 ENCOUNTER — Ambulatory Visit (INDEPENDENT_AMBULATORY_CARE_PROVIDER_SITE_OTHER): Payer: Self-pay | Admitting: Podiatry

## 2018-06-28 DIAGNOSIS — M2041 Other hammer toe(s) (acquired), right foot: Secondary | ICD-10-CM

## 2018-06-28 DIAGNOSIS — Z9889 Other specified postprocedural states: Secondary | ICD-10-CM

## 2018-06-28 NOTE — Progress Notes (Signed)
This encounter was created in error - please disregard.

## 2018-07-02 NOTE — Progress Notes (Signed)
   Subjective:  Patient presents today status post right forefoot surgery and left 5th metatarsal head resection. DOS: 03/16/18. He states he is doing well. He denies any significant pain or modifying factors. He has been wearing good shoe gear as directed. Patient is here for further evaluation and treatment.     Past Medical History:  Diagnosis Date  . Frequency of urination and polyuria 02/27/2015  . Hypertension   . Libido, decreased 04/22/2016  . Palpitations 02/15/2017  . Testosterone deficiency in male   . Tobacco dependence 04/03/2015      Objective/Physical Exam Neurovascular status intact.  Skin incisions appear to be well coapted. No sign of infectious process noted. No dehiscence. No active bleeding noted. Moderate edema noted to the surgical extremity.  Assessment: 1. s/p right forefoot surgery and left 5th metatarsal head resection. DOS: 03/16/18   Plan of Care:  1. Patient was evaluated.  2. May resume full activity with no restrictions.  3. Continue wearing good shoe gear.  4. Return to clinic as needed.    Felecia Shelling, DPM [facility]  Dr. Felecia Shelling, DPM    [address]                                        [place], [place] [address]                Office [phone]  Fax [phone]

## 2018-07-27 ENCOUNTER — Other Ambulatory Visit: Payer: Self-pay

## 2018-07-27 DIAGNOSIS — I10 Essential (primary) hypertension: Secondary | ICD-10-CM

## 2018-08-02 ENCOUNTER — Ambulatory Visit (INDEPENDENT_AMBULATORY_CARE_PROVIDER_SITE_OTHER): Payer: Commercial Managed Care - PPO | Admitting: Family Medicine

## 2018-08-02 ENCOUNTER — Other Ambulatory Visit: Payer: Self-pay

## 2018-08-02 ENCOUNTER — Encounter: Payer: Self-pay | Admitting: Family Medicine

## 2018-08-02 VITALS — BP 160/92 | HR 63 | Temp 98.2°F | Ht 72.0 in | Wt 209.0 lb

## 2018-08-02 DIAGNOSIS — Z23 Encounter for immunization: Secondary | ICD-10-CM | POA: Diagnosis not present

## 2018-08-02 DIAGNOSIS — F17200 Nicotine dependence, unspecified, uncomplicated: Secondary | ICD-10-CM

## 2018-08-02 DIAGNOSIS — I10 Essential (primary) hypertension: Secondary | ICD-10-CM

## 2018-08-02 DIAGNOSIS — Z131 Encounter for screening for diabetes mellitus: Secondary | ICD-10-CM

## 2018-08-02 LAB — POCT GLYCOSYLATED HEMOGLOBIN (HGB A1C): Hemoglobin A1C: 4.8 % (ref 4.0–5.6)

## 2018-08-02 LAB — POCT URINALYSIS DIP (MANUAL ENTRY)
Bilirubin, UA: NEGATIVE
Glucose, UA: NEGATIVE mg/dL
Ketones, POC UA: NEGATIVE mg/dL
Leukocytes, UA: NEGATIVE
Nitrite, UA: NEGATIVE
Protein Ur, POC: NEGATIVE mg/dL
Spec Grav, UA: >=1.03 — AB (ref 1.010–1.025)
Urobilinogen, UA: 0.2 E.U./dL
pH, UA: 5.5 (ref 5.0–8.0)

## 2018-08-02 MED ORDER — LISINOPRIL-HYDROCHLOROTHIAZIDE 10-12.5 MG PO TABS: 1.0000 | ORAL_TABLET | Freq: Every day | ORAL | 3 refills | Status: DC

## 2018-08-02 NOTE — Patient Instructions (Signed)

## 2018-08-02 NOTE — Progress Notes (Signed)
[facility]   Progress Note: General Provider: Mike Gip, FNP  SUBJECTIVE:   Joshua Blake. is a 48 y.o. male who  has a past medical history of Frequency of urination and polyuria (02/27/2015), Hypertension, Libido, decreased (04/22/2016), Palpitations (02/15/2017), Testosterone deficiency in male, and Tobacco dependence (04/03/2015).. Patient presents today for Follow-up (HTN ) Patient has not been seen since 2018. Patient reports being out of antihypertensives x 4 weeks. Denies low sodium diet or daily exercise. He reports smoking 1 pack per day. Not interested in quitting.   Review of Systems  Constitutional: Negative.   HENT: Negative.   Eyes: Negative.   Respiratory: Negative.   Cardiovascular: Positive for leg swelling.  Gastrointestinal: Negative.   Genitourinary: Negative.   Musculoskeletal: Negative.   Skin: Negative.   Neurological: Negative.   Psychiatric/Behavioral: Negative.      OBJECTIVE: BP (!) 160/92 (BP Location: Left Arm, Patient Position: Sitting, Cuff Size: Small)   Pulse 63   Temp 98.2 F (36.8 C) (Oral)   Ht 6' (1.829 m)   Wt 209 lb (94.8 kg)   SpO2 99%   BMI 28.35 kg/m   Wt Readings from Last 3 Encounters:  08/02/18 209 lb (94.8 kg)  04/13/17 214 lb (97.1 kg)  03/30/17 215 lb (97.5 kg)     Physical Exam Vitals signs and nursing note reviewed.  Constitutional:      General: He is not in acute distress.    Appearance: He is well-developed.  HENT:     Head: Normocephalic and atraumatic.  Eyes:     Conjunctiva/sclera: Conjunctivae normal.     Pupils: Pupils are equal, round, and reactive to light.  Neck:     Musculoskeletal: Normal range of motion.  Cardiovascular:     Rate and Rhythm: Normal rate and regular rhythm.     Heart sounds: Normal heart sounds.  Pulmonary:     Effort: Pulmonary effort is normal. No respiratory distress.     Breath sounds: Normal breath sounds.   Abdominal:     General: Bowel sounds are normal. There is no distension.     Palpations: Abdomen is soft.  Musculoskeletal: Normal range of motion.     Right lower leg: Edema (non pitting 1+) present.  Skin:    General: Skin is warm and dry.  Neurological:     Mental Status: He is alert and oriented to person, place, and time.  Psychiatric:        Behavior: Behavior normal.        Thought Content: Thought content normal.     ASSESSMENT/PLAN:  1. Essential hypertension Stop amlodipine. Start lisinopril-HCTZ 10-12.5. Discussed risks and benefits of this medication.  - CBC with Differential - Comprehensive metabolic panel - TSH - Lipid Panel - Microalbumin/Creatinine Ratio, Urine  2. Screening for diabetes mellitus a1c- 4.8 - POCT glycosylated hemoglobin (Hb A1C) - POCT urinalysis dipstick  3. Need for Tdap vaccination - Tdap vaccine greater than or equal to 7yo IM  4. Tobacco dependence Smoking cessation instruction/counseling given:  counseled patient on the dangers of tobacco use, advised patient to stop smoking, and reviewed strategies to maximize success      Return in about 3 months (around 11/01/2018) for HTN.    The patient was given clear instructions to go to ER or return to medical center if symptoms do not improve, worsen or new problems develop. The patient verbalized understanding and agreed with plan of care.  Ms. Doug Sou. Nathaneil Canary, FNP-BC [facility] [address] [place], [place] [address] [phone]

## 2018-08-03 LAB — LIPID PANEL
Chol/HDL Ratio: 2.2 ratio (ref 0.0–5.0)
Cholesterol, Total: 155 mg/dL (ref 100–199)
HDL: 69 mg/dL (ref >39)
LDL Calculated: 62 mg/dL (ref 0–99)
Triglycerides: 121 mg/dL (ref 0–149)
VLDL Cholesterol Cal: 24 mg/dL (ref 5–40)

## 2018-08-03 LAB — CBC WITH DIFFERENTIAL/PLATELET
Basophils Absolute: 0.1 10*3/uL (ref 0.0–0.2)
Basos: 1 %
EOS (ABSOLUTE): 0.2 10*3/uL (ref 0.0–0.4)
Eos: 4 %
Hematocrit: 42 % (ref 37.5–51.0)
Hemoglobin: 14.3 g/dL (ref 13.0–17.7)
Immature Grans (Abs): 0 10*3/uL (ref 0.0–0.1)
Immature Granulocytes: 0 %
Lymphocytes Absolute: 1.5 10*3/uL (ref 0.7–3.1)
Lymphs: 30 %
MCH: 32.4 pg (ref 26.6–33.0)
MCHC: 34 g/dL (ref 31.5–35.7)
MCV: 95 fL (ref 79–97)
Monocytes Absolute: 0.4 10*3/uL (ref 0.1–0.9)
Monocytes: 8 %
Neutrophils Absolute: 2.9 10*3/uL (ref 1.4–7.0)
Neutrophils: 57 %
Platelets: 213 10*3/uL (ref 150–450)
RBC: 4.42 x10E6/uL (ref 4.14–5.80)
RDW: 10.8 % — ABNORMAL LOW (ref 11.6–15.4)
WBC: 5.1 10*3/uL (ref 3.4–10.8)

## 2018-08-03 LAB — MICROALBUMIN / CREATININE URINE RATIO
Creatinine, Urine: 193.6 mg/dL
Microalb/Creat Ratio: 10 mg/g creat (ref 0–29)
Microalbumin, Urine: 20.1 ug/mL

## 2018-08-03 LAB — COMPREHENSIVE METABOLIC PANEL
ALT: 54 IU/L — ABNORMAL HIGH (ref 0–44)
AST: 33 IU/L (ref 0–40)
Albumin/Globulin Ratio: 2.1 (ref 1.2–2.2)
Albumin: 4.5 g/dL (ref 4.0–5.0)
Alkaline Phosphatase: 89 IU/L (ref 39–117)
BUN/Creatinine Ratio: 10 (ref 9–20)
BUN: 11 mg/dL (ref 6–24)
Bilirubin Total: 0.5 mg/dL (ref 0.0–1.2)
CO2: 25 mmol/L (ref 20–29)
Calcium: 9.7 mg/dL (ref 8.7–10.2)
Chloride: 102 mmol/L (ref 96–106)
Creatinine, Ser: 1.1 mg/dL (ref 0.76–1.27)
GFR calc Af Amer: 93 mL/min/{1.73_m2} (ref >59)
GFR calc non Af Amer: 80 mL/min/{1.73_m2} (ref >59)
Globulin, Total: 2.1 g/dL (ref 1.5–4.5)
Glucose: 100 mg/dL — ABNORMAL HIGH (ref 65–99)
Potassium: 4.6 mmol/L (ref 3.5–5.2)
Sodium: 141 mmol/L (ref 134–144)
Total Protein: 6.6 g/dL (ref 6.0–8.5)

## 2018-08-03 LAB — TSH: TSH: 1.57 u[IU]/mL (ref 0.450–4.500)

## 2018-08-18 ENCOUNTER — Telehealth: Payer: Self-pay

## 2018-08-18 DIAGNOSIS — E291 Testicular hypofunction: Secondary | ICD-10-CM

## 2018-08-18 DIAGNOSIS — R339 Retention of urine, unspecified: Secondary | ICD-10-CM

## 2018-08-18 DIAGNOSIS — R6882 Decreased libido: Secondary | ICD-10-CM

## 2018-08-18 NOTE — Telephone Encounter (Signed)
Called and spoke with patient, advised that we will send in referral for urology. Advised that if he has any painful urination, blood in his urine, fever or chills or can not pass urine he needs to go to the nearest urgent care. Patient verbalized understand. Thanks!

## 2018-08-18 NOTE — Telephone Encounter (Signed)
Patient's wife called and is saying that patient is having trouble urinating and this has been on going for months. She says he is getting up more than 4 times during the night and is not completing emptying his bladder. She is asking if he can be referred to Urology for this. Please advise. Thanks!

## 2018-08-29 NOTE — Telephone Encounter (Signed)
Message sent to provider 

## 2018-11-06 ENCOUNTER — Ambulatory Visit: Payer: Commercial Managed Care - PPO | Admitting: Family Medicine

## 2018-12-04 ENCOUNTER — Encounter: Payer: Self-pay | Admitting: Podiatry

## 2018-12-04 ENCOUNTER — Other Ambulatory Visit: Payer: Self-pay

## 2018-12-04 ENCOUNTER — Other Ambulatory Visit: Payer: Self-pay | Admitting: Podiatry

## 2018-12-04 ENCOUNTER — Ambulatory Visit (INDEPENDENT_AMBULATORY_CARE_PROVIDER_SITE_OTHER): Payer: Commercial Managed Care - PPO | Admitting: Podiatry

## 2018-12-04 ENCOUNTER — Ambulatory Visit (INDEPENDENT_AMBULATORY_CARE_PROVIDER_SITE_OTHER): Payer: Commercial Managed Care - PPO

## 2018-12-04 VITALS — Temp 97.3°F

## 2018-12-04 DIAGNOSIS — Z9889 Other specified postprocedural states: Secondary | ICD-10-CM | POA: Diagnosis not present

## 2018-12-04 DIAGNOSIS — M79671 Pain in right foot: Secondary | ICD-10-CM

## 2018-12-04 DIAGNOSIS — R600 Localized edema: Secondary | ICD-10-CM | POA: Diagnosis not present

## 2018-12-04 DIAGNOSIS — M898X9 Other specified disorders of bone, unspecified site: Secondary | ICD-10-CM | POA: Diagnosis not present

## 2018-12-04 MED ORDER — MELOXICAM 15 MG PO TABS: 15.0000 mg | ORAL_TABLET | Freq: Every day | ORAL | 2 refills | Status: AC

## 2018-12-07 ENCOUNTER — Telehealth: Payer: Self-pay | Admitting: *Deleted

## 2018-12-07 NOTE — Telephone Encounter (Signed)
Pt's wife, Conception Oms called for pt's restrictions on the job.

## 2018-12-07 NOTE — Telephone Encounter (Signed)
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 I told Conception Oms I would ask Dr. Amalia Hailey and call again.

## 2018-12-07 NOTE — Progress Notes (Signed)
   Subjective:  Patient presents today status post right forefoot surgery and left 5th metatarsal head resection. DOS: 03/16/18. He reports some right foot swelling that has never resolved. He states he still has difficulty moving his toes and is still experiencing some pain. There are no modifying factors noted. He has been wearing good shoe gear which has not helped alleviate his symptoms. Patient is here for further evaluation and treatment.   Past Medical History:  Diagnosis Date  . Frequency of urination and polyuria 02/27/2015  . Hypertension   . Libido, decreased 04/22/2016  . Palpitations 02/15/2017  . Testosterone deficiency in male   . Tobacco dependence 04/03/2015      Objective/Physical Exam Neurovascular status intact.  Skin incisions appear to be well coapted. No sign of infectious process noted. No dehiscence. No active bleeding noted. Chronic edema noted to the right foot. Pain on palpation noted to the right fifth metatarsal.   Radiographic Exam: Absence of the 5th metatarsal head noted with bony exostosis. Orthopedic hardware otherwise intact.   Assessment: 1. s/p right forefoot surgery and left 5th metatarsal head resection. DOS: 03/16/18 2. Chronic edema right foot 3. Bony exostosis right fifth metatarsal    Plan of Care:  1. Patient was evaluated. X-Rays reviewed.  2. Unna boot applied. Keep clean, dry and intact for three days.  3. Recommended OTC compression socks daily.  4. Patient will come in to sign the surgical consent forms when he would like to have surgery for bony exostosis to the right fifth metatarsal.  5. Prescription for Meloxicam provided to patient. 6. Return to clinic as needed.   Works at [employer] for [employer].     Edrick Kins, DPM [facility]  Dr. Edrick Kins, [address]                                        [place], [place] [address]                Office [phone]  Fax [phone]

## 2018-12-28 ENCOUNTER — Telehealth: Payer: Self-pay | Admitting: Podiatry

## 2018-12-28 NOTE — Telephone Encounter (Signed)
Pt was out of work this whole week due to swelling of his foot. Requested a work note for him being out and to return to work on Monday, 01/01/2019.

## 2018-12-28 NOTE — Telephone Encounter (Signed)
I called Joshua Blake and my head set shut off. I had to wait until the head set came back on and called Joshua Blake again. I told Joshua Blake that Dr. Amalia Hailey would write him out of work until Monday 01/01/2019. Joshua Blake requested the note be sent to [email or web address], and [email or web address].

## 2019-01-10 ENCOUNTER — Encounter

## 2019-01-10 ENCOUNTER — Encounter (HOSPITAL_COMMUNITY): Payer: Self-pay | Admitting: *Deleted

## 2019-01-10 ENCOUNTER — Encounter (HOSPITAL_COMMUNITY): Payer: Self-pay

## 2019-01-10 ENCOUNTER — Ambulatory Visit (INDEPENDENT_AMBULATORY_CARE_PROVIDER_SITE_OTHER): Payer: Commercial Managed Care - PPO | Admitting: Podiatry

## 2019-01-10 ENCOUNTER — Other Ambulatory Visit: Payer: Self-pay

## 2019-01-10 ENCOUNTER — Encounter: Payer: Self-pay | Admitting: Podiatry

## 2019-01-10 DIAGNOSIS — M898X9 Other specified disorders of bone, unspecified site: Secondary | ICD-10-CM

## 2019-01-10 DIAGNOSIS — R600 Localized edema: Secondary | ICD-10-CM

## 2019-01-10 NOTE — Patient Instructions (Signed)
Pre-Operative Instructions  Congratulations, you have decided to take an important step towards improving your quality of life.  You can be assured that the doctors and staff at [facility] will be with you every step of the way.  Here are some important things you should know:  1. Plan to be at the surgery center/hospital at least 1 (one) hour prior to your scheduled time, unless otherwise directed by the surgical center/hospital staff.  You must have a responsible adult accompany you, remain during the surgery and drive you home.  Make sure you have directions to the surgical center/hospital to ensure you arrive on time. 2. If you are having surgery at [facility] or [facility], you will need a copy of your medical history and physical form from your family physician within one month prior to the date of surgery. We will give you a form for your primary physician to complete.  3. We make every effort to accommodate the date you request for surgery.  However, there are times where surgery dates or times have to be moved.  We will contact you as soon as possible if a change in schedule is required.   4. No aspirin/ibuprofen for one week before surgery.  If you are on aspirin, any non-steroidal anti-inflammatory medications (Mobic, Aleve, Ibuprofen) should not be taken seven (7) days prior to your surgery.  You make take Tylenol for pain prior to surgery.  5. Medications - If you are taking daily heart and blood pressure medications, seizure, reflux, allergy, asthma, anxiety, pain or diabetes medications, make sure you notify the surgery center/hospital before the day of surgery so they can tell you which medications you should take or avoid the day of surgery. 6. No food or drink after midnight the night before surgery unless directed otherwise by surgical center/hospital staff. 7. No alcoholic beverages 24-hours prior to surgery.  No smoking 24-hours prior or 24-hours after  surgery. 8. Wear loose pants or shorts. They should be loose enough to fit over bandages, boots, and casts. 9. Don't wear slip-on shoes. Sneakers are preferred. 10. Bring your boot with you to the surgery center/hospital.  Also bring crutches or a walker if your physician has prescribed it for you.  If you do not have this equipment, it will be provided for you after surgery. 11. If you have not been contacted by the surgery center/hospital by the day before your surgery, call to confirm the date and time of your surgery. 12. Leave-time from work may vary depending on the type of surgery you have.  Appropriate arrangements should be made prior to surgery with your employer. 13. Prescriptions will be provided immediately following surgery by your doctor.  Fill these as soon as possible after surgery and take the medication as directed. Pain medications will not be refilled on weekends and must be approved by the doctor. 14. Remove nail polish on the operative foot and avoid getting pedicures prior to surgery. 15. Wash the night before surgery.  The night before surgery wash the foot and leg well with water and the antibacterial soap provided. Be sure to pay special attention to beneath the toenails and in between the toes.  Wash for at least three (3) minutes. Rinse thoroughly with water and dry well with a towel.  Perform this wash unless told not to do so by your physician.  Enclosed: 1 Ice pack (please put in freezer the night before surgery)   1 Hibiclens skin cleaner     Pre-op instructions  If you have any questions regarding the instructions, please do not hesitate to call our office.  [place]: [address], [place], [place] [address] -- [phone]  [place]: [address] [place], [place] [address] -- [phone]  [place]: [address]  [place], [place] [address] -- [phone]  [place]: [address], [place], [place] [address] -- [phone]  Website:  [email or web address] 

## 2019-01-13 NOTE — Progress Notes (Signed)
   Subjective:  Patient presents today status post right forefoot surgery and left 5th metatarsal head resection. DOS: 03/16/18. He reports his swelling has improved but not resolved. He has using compression socks and taking Meloxicam for treatment. There are no modifying factors noted. Patient is here for further evaluation and treatment.   Past Medical History:  Diagnosis Date  . Frequency of urination and polyuria 02/27/2015  . Hypertension   . Libido, decreased 04/22/2016  . Palpitations 02/15/2017  . Testosterone deficiency in male   . Tobacco dependence 04/03/2015      Objective/Physical Exam Neurovascular status intact.  Skin incisions appear to be well coapted. No sign of infectious process noted. No dehiscence. No active bleeding noted. Chronic edema noted to the right foot. Pain on palpation noted to the right fifth metatarsal.   Assessment: 1. s/p right forefoot surgery and left 5th metatarsal head resection. DOS: 03/16/18 2. Chronic edema right foot 3. Bony exostosis right fifth metatarsal    Plan of Care:  1. Patient was evaluated.  2. Today we discussed the conservative versus surgical management of the presenting pathology. The patient opts for surgical management. All possible complications and details of the procedure were explained. All patient questions were answered. No guarantees were expressed or implied. 3. Authorization for surgery was initiated today. Surgery will consist of 5th metatarsal exostectomy right.  4. Unna boot applied.  5. Return to clinic one week post op.    Works at [employer] for [employer].     Edrick Kins, DPM [facility]  Dr. Edrick Kins, [address]                                        [place], [place] [address]                Office [phone]  Fax [phone]

## 2019-02-06 ENCOUNTER — Telehealth: Payer: Self-pay | Admitting: Podiatry

## 2019-02-06 NOTE — Telephone Encounter (Signed)
DOS: 03/01/2019 SURGICAL PROCEDURE: Metatarsal Head Res. 5th Rt CPT CODE: 48628 DX CODE: M89.8X9 (Bony Exostosis)  [place] Effective Date: 07/03/2018  In Network Benefits:  Family Integrated Deductible is $4,000 which has been met. Family Integrated Out of Pocket is $6,000 with $5,902.91 met.  Benefits are 80% Insurance and 20% patient.  No Prior Authorization is required.  Ref# [id number] per [place].

## 2019-02-07 ENCOUNTER — Other Ambulatory Visit: Payer: Self-pay

## 2019-02-07 MED ORDER — LISINOPRIL-HYDROCHLOROTHIAZIDE 10-12.5 MG PO TABS: 1.0000 | ORAL_TABLET | Freq: Every day | ORAL | 0 refills | Status: DC

## 2019-02-07 MED FILL — LISINOPRIL-HCTZ 10-12.5 MG: 10-12.5 | 30 days supply | Qty: 30 | Fill #0

## 2019-02-27 ENCOUNTER — Telehealth: Payer: Self-pay | Admitting: *Deleted

## 2019-02-27 NOTE — Telephone Encounter (Signed)
"  I'm calling in regards to [facility].  We need to take him off the schedule for Thursday because has a bad debt and he refuses to pay anything."  I'll let Dr. Amalia Hailey know.

## 2019-03-07 ENCOUNTER — Encounter: Payer: Self-pay | Admitting: Family Medicine

## 2019-03-07 ENCOUNTER — Encounter: Payer: Commercial Managed Care - PPO | Admitting: Podiatry

## 2019-03-07 ENCOUNTER — Ambulatory Visit (INDEPENDENT_AMBULATORY_CARE_PROVIDER_SITE_OTHER): Payer: Commercial Managed Care - PPO | Admitting: Family Medicine

## 2019-03-07 ENCOUNTER — Other Ambulatory Visit: Payer: Self-pay

## 2019-03-07 VITALS — BP 122/90 | HR 76 | Temp 98.8°F | Ht 72.0 in | Wt 206.6 lb

## 2019-03-07 DIAGNOSIS — M7751 Other enthesopathy of right foot: Secondary | ICD-10-CM | POA: Diagnosis not present

## 2019-03-07 DIAGNOSIS — Z0000 Encounter for general adult medical examination without abnormal findings: Secondary | ICD-10-CM | POA: Diagnosis not present

## 2019-03-07 DIAGNOSIS — I160 Hypertensive urgency: Secondary | ICD-10-CM | POA: Diagnosis not present

## 2019-03-07 DIAGNOSIS — M25642 Stiffness of left hand, not elsewhere classified: Secondary | ICD-10-CM

## 2019-03-07 DIAGNOSIS — M25641 Stiffness of right hand, not elsewhere classified: Secondary | ICD-10-CM

## 2019-03-07 DIAGNOSIS — Z09 Encounter for follow-up examination after completed treatment for conditions other than malignant neoplasm: Secondary | ICD-10-CM

## 2019-03-07 DIAGNOSIS — I10 Essential (primary) hypertension: Secondary | ICD-10-CM | POA: Diagnosis not present

## 2019-03-07 LAB — POCT GLYCOSYLATED HEMOGLOBIN (HGB A1C): Hemoglobin A1C: 4.8 % (ref 4.0–5.6)

## 2019-03-07 LAB — POCT URINALYSIS DIPSTICK
Bilirubin, UA: NEGATIVE
Glucose, UA: NEGATIVE
Ketones, UA: NEGATIVE
Leukocytes, UA: NEGATIVE
Nitrite, UA: NEGATIVE
Protein, UA: NEGATIVE
Spec Grav, UA: >=1.03 — AB (ref 1.010–1.025)
Urobilinogen, UA: 0.2 E.U./dL
pH, UA: 5.5 (ref 5.0–8.0)

## 2019-03-07 LAB — GLUCOSE, POCT (MANUAL RESULT ENTRY): POC Glucose: 109 mg/dl — AB (ref 70–99)

## 2019-03-07 MED ORDER — ACETAMINOPHEN 500 MG PO TABS: 500.0000 mg | ORAL_TABLET | Freq: Two times a day (BID) | ORAL | 3 refills | Status: DC

## 2019-03-07 MED ORDER — CLONIDINE HCL 0.1 MG PO TABS: 0.3000 mg | ORAL_TABLET | Freq: Once | ORAL | Status: AC

## 2019-03-07 NOTE — Progress Notes (Signed)
[facility]   Established Patient Office Visit   Subjective:  Patient ID: Joshua KlinefelterHarvey Blake Ham Jr., male    DOB: 1971-02-29  Age: 48 y.o. MRN: [id number]  CC:  Chief Complaint  Patient presents with  . Establish Care    Former [name] NP patient  . Medication Refill    Medication Management - blood pressue meds  . Hand Pain    onset one month ago - finger joints lock up    HPI Joshua KlinefelterHarvey Blake Insalaco Jr. is a [id number] male who presents today for Follow Up today.   Past Medical History:  Diagnosis Date  . Frequency of urination and polyuria 02/27/2015  . Hypertension   . Libido, decreased 04/22/2016  . Palpitations 02/15/2017  . Testosterone deficiency in male   . Tobacco dependence 04/03/2015   Current Status: This will be his initial office visit with me. He was previously seeing [name], NP for his PCP needs. He has c/o of joint pain in both hands X 1-2 few months. He is currently not taking any medications for relief of pain. He works with his hands. His blood pressures are elevated today. He states that he just took his blood pressure medications prior to arrival to his appointment this morning. He also admits to drinking 2 cups of coffee and smoking prior to his arrival. He denies visual changes, chest pain, cough, shortness of breath, heart palpitations, and falls. He has occasional headaches and dizziness with position changes. Denies severe headaches, confusion, seizures, double vision, and blurred vision, nausea and vomiting. He denies fevers, chills, fatigue, recent infections, weight loss, and night sweats. He has not had any headaches, visual changes, dizziness, and falls. No chest pain, heart palpitations, cough and shortness of breath reported. No reports of GI problems such as nausea, vomiting, diarrhea, and constipation. He has no reports of blood in stools, dysuria and hematuria. No depression or anxiety reported today.  He denies  pain today.   Past Surgical History:  Procedure Laterality Date  . FOOT SURGERY      Family History  Problem Relation Age of Onset  . Hypertension Mother   . Hypertension Father   . Diabetes Maternal Grandmother     Social History   Socioeconomic History  . Marital status: Married    Spouse name: Not on file  . Number of children: Not on file  . Years of education: Not on file  . Highest education level: Not on file  Occupational History  . Not on file  Social Needs  . Financial resource strain: Not on file  . Food insecurity    Worry: Not on file    Inability: Not on file  . Transportation needs    Medical: Not on file    Non-medical: Not on file  Tobacco Use  . Smoking status: Current Every Day Smoker    Packs/day: 1.00  . Smokeless tobacco: Current User  Substance and Sexual Activity  . Alcohol use: Yes    Alcohol/week: 42.0 standard drinks    Types: 42 Cans of beer per week    Comment: daily  . Drug use: No  . Sexual activity: Not on file  Lifestyle  . Physical activity    Days per week: Not on file    Minutes per session: Not on file  . Stress: Not on file  Relationships  . Social connections    Talks on phone: Not on file  Gets together: Not on file    Attends religious service: Not on file    Active member of club or organization: Not on file    Attends meetings of clubs or organizations: Not on file    Relationship status: Not on file  . Intimate partner violence    Fear of current or ex partner: Not on file    Emotionally abused: Not on file    Physically abused: Not on file    Forced sexual activity: Not on file  Other Topics Concern  . Not on file  Social History Narrative  . Not on file    Outpatient Medications Prior to Visit  Medication Sig Dispense Refill  . amLODipine (NORVASC) 5 MG tablet Take 1 tablet (5 mg total) by mouth daily. 90 tablet 1  . lisinopril-hydrochlorothiazide (ZESTORETIC) 10-12.5 MG tablet Take 1 tablet by mouth  daily. 30 tablet 0  . Testosterone 40.5 MG/2.5GM (1.62%) GEL Apply 40.5 mg daily (2 pumps) daily to shoulder(s) or upper arms(s). 2.5 g 1   No facility-administered medications prior to visit.     No Known Allergies  ROS Review of Systems  Constitutional: Negative.   HENT: Negative.   Eyes: Negative.   Respiratory: Negative.   Cardiovascular: Negative.   Gastrointestinal: Negative.   Endocrine: Negative.   Genitourinary: Negative.   Musculoskeletal: Negative.   Allergic/Immunologic: Negative.   Neurological: Positive for dizziness (occasional ) and headaches (occasional ).  Hematological: Negative.   Psychiatric/Behavioral: Negative.    Objective:    Physical Exam  Constitutional: He is oriented to person, place, and time. He appears well-developed and well-nourished.  HENT:  Head: Normocephalic and atraumatic.  Eyes: Conjunctivae are normal.  Neck: Normal range of motion. Neck supple.  Cardiovascular: Normal rate, regular rhythm, normal heart sounds and intact distal pulses.  Pulmonary/Chest: Effort normal and breath sounds normal.  Abdominal: Soft. Bowel sounds are normal.  Musculoskeletal: Normal range of motion.  Neurological: He is alert and oriented to person, place, and time. He has normal reflexes.  Skin: Skin is warm and dry.  Psychiatric: He has a normal mood and affect. His behavior is normal. Judgment and thought content normal.  Nursing note and vitals reviewed.  BP 122/90 Comment: manually  Pulse 76   Temp 98.8 F (37.1 C) (Oral)   Ht 6' (1.829 m)   Wt 206 lb 9.6 oz (93.7 kg)   SpO2 98%   BMI 28.02 kg/m  Wt Readings from Last 3 Encounters:  03/07/19 206 lb 9.6 oz (93.7 kg)  08/02/18 209 lb (94.8 kg)  04/13/17 214 lb (97.1 kg)     Health Maintenance Due  Topic Date Due  . HIV Screening  12/27/1986    There are no preventive care reminders to display for this patient.  Lab Results  Component Value Date   TSH 1.570 08/02/2018   Lab  Results  Component Value Date   WBC 5.1 08/02/2018   HGB 14.3 08/02/2018   HCT 42.0 08/02/2018   MCV 95 08/02/2018   PLT 213 08/02/2018   Lab Results  Component Value Date   NA 141 08/02/2018   K 4.6 08/02/2018   CO2 25 08/02/2018   GLUCOSE 100 (H) 08/02/2018   BUN 11 08/02/2018   CREATININE 1.10 08/02/2018   BILITOT 0.5 08/02/2018   ALKPHOS 89 08/02/2018   AST 33 08/02/2018   ALT 54 (H) 08/02/2018   PROT 6.6 08/02/2018   ALBUMIN 4.5 08/02/2018   CALCIUM 9.7 08/02/2018  ANIONGAP 12 01/03/2017   Lab Results  Component Value Date   CHOL 155 08/02/2018   Lab Results  Component Value Date   HDL 69 08/02/2018   Lab Results  Component Value Date   LDLCALC 62 08/02/2018   Lab Results  Component Value Date   TRIG 121 08/02/2018   Lab Results  Component Value Date   CHOLHDL 2.2 08/02/2018   Lab Results  Component Value Date   HGBA1C 4.8 03/07/2019    Assessment & Plan:   1. Asymptomatic hypertensive urgency Blood pressures are elevated today. Clonidine 0.3 mg given to patient in office and blood pressures remain elevated. We referred him to ED via ambulance at this time. Patient refused and signed AMA form at discharge. He denies severe headaches, confusion, seizures, double vision, and blurred vision, nausea and vomiting. He will report to ED if he experiences these symptoms. Patient verbalized understanding. He will began taking his hypertensive medications as prescribed.   2. Essential hypertension he will continue to take medications as prescribed, to decrease high sodium intake, excessive alcohol intake, increase potassium intake, smoking cessation, and increase physical activity of at least 30 minutes of cardio activity daily. He will continue to follow Heart Healthy or DASH diet. - POCT urinalysis dipstick - cloNIDine (CATAPRES) tablet 0.3 mg  3. Stiffness of joints of both hands - Uric Acid - Rheumatoid Arthritis Profile - acetaminophen (TYLENOL) 500 MG  tablet; Take 1 tablet (500 mg total) by mouth 2 (two) times daily.  Dispense: 60 tablet; Refill: 3  4. Bone spur of right foot He had to cancel appointment for spur because of financial problems.  5. Healthcare maintenance - POCT glycosylated hemoglobin (Hb A1C) - POCT urinalysis dipstick - POCT glucose (manual entry)  6. Follow up He will follow up in 1 week for blood pressure check only. He will follow up in 1 month for office visit.  Meds ordered this encounter  Medications  . acetaminophen (TYLENOL) 500 MG tablet    Sig: Take 1 tablet (500 mg total) by mouth 2 (two) times daily.    Dispense:  60 tablet    Refill:  3  . cloNIDine (CATAPRES) tablet 0.3 mg    Orders Placed This Encounter  Procedures  . Uric Acid  . Rheumatoid Arthritis Profile  . POCT glycosylated hemoglobin (Hb A1C)  . POCT urinalysis dipstick  . POCT glucose (manual entry)    Referral Orders  No referral(s) requested today    Raliegh Ip,  MSN, FNP-BC [facility] [address] [place], [place] [address] [phone]- fax  Problem List Items Addressed This Visit      Cardiovascular and Mediastinum   Essential hypertension   Relevant Medications   cloNIDine (CATAPRES) tablet 0.3 mg   Other Relevant Orders   POCT urinalysis dipstick (Completed)    Other Visit Diagnoses    Asymptomatic hypertensive urgency    -  Primary   Relevant Medications   cloNIDine (CATAPRES) tablet 0.3 mg   Stiffness of joints of both hands       Relevant Medications   acetaminophen (TYLENOL) 500 MG tablet   Other Relevant Orders   Uric Acid (Completed)   Rheumatoid Arthritis Profile (Completed)   Bone spur of right foot       Healthcare maintenance       Relevant Orders   POCT glycosylated hemoglobin (Hb A1C) (Completed)   POCT urinalysis dipstick (Completed)  POCT glucose (manual entry) (Completed)   Follow up           Meds ordered this encounter  Medications  . acetaminophen (TYLENOL) 500 MG tablet    Sig: Take 1 tablet (500 mg total) by mouth 2 (two) times daily.    Dispense:  60 tablet    Refill:  3  . cloNIDine (CATAPRES) tablet 0.3 mg    Follow-up: No follow-ups on file.    Azzie Glatter, FNP

## 2019-03-09 LAB — RHEUMATOID ARTHRITIS PROFILE
Cyclic Citrullin Peptide Ab: 6 units (ref 0–19)
Rheumatoid fact SerPl-aCnc: <10 [IU]/mL (ref 0.0–13.9)

## 2019-03-09 LAB — URIC ACID: Uric Acid: 5.4 mg/dL (ref 3.7–8.6)

## 2019-03-19 ENCOUNTER — Encounter: Payer: Commercial Managed Care - PPO | Admitting: Podiatry

## 2019-04-06 ENCOUNTER — Ambulatory Visit: Payer: Commercial Managed Care - PPO | Admitting: Family Medicine

## 2019-07-31 ENCOUNTER — Ambulatory Visit (INDEPENDENT_AMBULATORY_CARE_PROVIDER_SITE_OTHER): Payer: Commercial Managed Care - PPO | Admitting: Family Medicine

## 2019-07-31 ENCOUNTER — Other Ambulatory Visit: Payer: Self-pay

## 2019-07-31 ENCOUNTER — Encounter: Payer: Self-pay | Admitting: Family Medicine

## 2019-07-31 VITALS — BP 150/89 | HR 82 | Temp 98.4°F | Ht 72.0 in | Wt 208.8 lb

## 2019-07-31 DIAGNOSIS — I10 Essential (primary) hypertension: Secondary | ICD-10-CM | POA: Diagnosis not present

## 2019-07-31 DIAGNOSIS — I160 Hypertensive urgency: Secondary | ICD-10-CM | POA: Diagnosis not present

## 2019-07-31 DIAGNOSIS — Z09 Encounter for follow-up examination after completed treatment for conditions other than malignant neoplasm: Secondary | ICD-10-CM

## 2019-07-31 LAB — POCT URINALYSIS DIPSTICK
Bilirubin, UA: NEGATIVE
Blood, UA: NEGATIVE
Glucose, UA: NEGATIVE
Ketones, UA: NEGATIVE
Leukocytes, UA: NEGATIVE
Nitrite, UA: NEGATIVE
Protein, UA: NEGATIVE
Spec Grav, UA: 1.025 (ref 1.010–1.025)
Urobilinogen, UA: 0.2 E.U./dL
pH, UA: 5 (ref 5.0–8.0)

## 2019-07-31 MED ORDER — LISINOPRIL-HYDROCHLOROTHIAZIDE 10-12.5 MG PO TABS: 1.0000 | ORAL_TABLET | Freq: Every day | ORAL | 6 refills | Status: DC

## 2019-07-31 MED ORDER — AMLODIPINE BESYLATE 5 MG PO TABS: 5.0000 mg | ORAL_TABLET | Freq: Every day | ORAL | 6 refills | Status: DC

## 2019-07-31 NOTE — Progress Notes (Signed)
[facility]  Established Patient Office Visit  Subjective:  Patient ID: Joshua Fontaine., male    DOB: 1970-05-29  Age: 49 y.o. MRN: [id number]  CC:  Chief Complaint  Patient presents with  . Follow-up  . Referral    polycystic kidney disease questions    HPI Joshua Blake. is a 49 year old male who presents for Follow Up today.   Past Medical History:  Diagnosis Date  . Frequency of urination and polyuria 02/27/2015  . Hypertension   . Libido, decreased 04/22/2016  . Palpitations 02/15/2017  . Testosterone deficiency in male   . Tobacco dependence 04/03/2015   Current Status: Since his last office visit, he is doing well with no complaints. He denies visual changes, chest pain, cough, shortness of breath, heart palpitations, and falls. He has occasional headaches and dizziness with position changes. Denies severe headaches, confusion, seizures, double vision, and blurred vision, nausea and vomiting. He denies fevers, chills, fatigue, recent infections, weight loss, and night sweats. He has not had any headaches, visual changes, dizziness, and falls. No chest pain, heart palpitations, cough and shortness of breath reported. No reports of GI problems such as nausea, vomiting, diarrhea, and constipation. He has no reports of blood in stools, dysuria and hematuria. No depression or anxiety, and denies suicidal ideations, homicidal ideations, or auditory hallucinations. He is taking all medications as prescribed. He denies pain today.   Past Surgical History:  Procedure Laterality Date  . FOOT SURGERY      Family History  Problem Relation Age of Onset  . Hypertension Mother   . Hypertension Father   . Diabetes Maternal Grandmother     Social History   Socioeconomic History  . Marital status: Married    Spouse name: Not on file  . Number of children: Not on file  . Years of education: Not on file  . Highest  education level: Not on file  Occupational History  . Not on file  Tobacco Use  . Smoking status: Current Every Day Smoker    Packs/day: 1.00  . Smokeless tobacco: Current User  Substance and Sexual Activity  . Alcohol use: Yes    Alcohol/week: 42.0 standard drinks    Types: 42 Cans of beer per week    Comment: daily  . Drug use: No  . Sexual activity: Yes  Other Topics Concern  . Not on file  Social History Narrative  . Not on file   Social Determinants of Health   Financial Resource Strain:   . Difficulty of Paying Living Expenses:   Food Insecurity:   . Worried About Programme researcher, broadcasting/film/video in the Last Year:   . Barista in the Last Year:   Transportation Needs:   . Freight forwarder (Medical):   Marland Kitchen Lack of Transportation (Non-Medical):   Physical Activity:   . Days of Exercise per Week:   . Minutes of Exercise per Session:   Stress:   . Feeling of Stress :   Social Connections:   . Frequency of Communication with Friends and Family:   . Frequency of Social Gatherings with Friends and Family:   . Attends Religious Services:   . Active Member of Clubs or Organizations:   . Attends Banker Meetings:   Marland Kitchen Marital Status:   Intimate Partner Violence:   . Fear of Current or Ex-Partner:   . Emotionally Abused:   .  Physically Abused:   . Sexually Abused:     Outpatient Medications Prior to Visit  Medication Sig Dispense Refill  . acetaminophen (TYLENOL) 500 MG tablet Take 1 tablet (500 mg total) by mouth 2 (two) times daily. 60 tablet 3  . Testosterone 40.5 MG/2.5GM (1.62%) GEL Apply 40.5 mg daily (2 pumps) daily to shoulder(s) or upper arms(s). (Patient not taking: Reported on 07/31/2019) 2.5 g 1  . amLODipine (NORVASC) 5 MG tablet Take 1 tablet (5 mg total) by mouth daily. (Patient not taking: Reported on 07/31/2019) 90 tablet 1  . lisinopril-hydrochlorothiazide (ZESTORETIC) 10-12.5 MG tablet Take 1 tablet by mouth daily. (Patient not taking:  Reported on 07/31/2019) 30 tablet 0   Facility-Administered Medications Prior to Visit  Medication Dose Route Frequency Provider Last Rate Last Admin  . cloNIDine (CATAPRES) tablet 0.3 mg  0.3 mg Oral Once Azzie Glatter, FNP        No Known Allergies  ROS Review of Systems  Constitutional: Negative.   HENT: Negative.   Eyes: Negative.   Respiratory: Negative.   Cardiovascular: Negative.   Gastrointestinal: Negative.   Endocrine: Negative.   Genitourinary: Negative.   Musculoskeletal: Negative.   Skin: Negative.   Allergic/Immunologic: Negative.   Neurological: Positive for dizziness (occasional ) and headaches (occasional).  Hematological: Negative.   Psychiatric/Behavioral: Negative.       Objective:    Physical Exam  Constitutional: He is oriented to person, place, and time. He appears well-developed and well-nourished.  HENT:  Head: Normocephalic and atraumatic.  Eyes: Conjunctivae are normal.  Cardiovascular: Normal rate, regular rhythm, normal heart sounds and intact distal pulses.  Pulmonary/Chest: Effort normal and breath sounds normal.  Abdominal: Soft. Bowel sounds are normal.  Musculoskeletal:        General: Normal range of motion.     Cervical back: Normal range of motion and neck supple.  Neurological: He is alert and oriented to person, place, and time. He has normal reflexes.  Skin: Skin is warm.  Psychiatric: He has a normal mood and affect. His behavior is normal. Judgment and thought content normal.  Nursing note and vitals reviewed.   BP (!) 150/89 Comment: Off track of taking BP meds due to working hours  Pulse 82   Temp 98.4 F (36.9 C) (Oral)   Ht 6' (1.829 m)   Wt 208 lb 12.8 oz (94.7 kg)   SpO2 100%   BMI 28.32 kg/m  Wt Readings from Last 3 Encounters:  07/31/19 208 lb 12.8 oz (94.7 kg)  03/07/19 206 lb 9.6 oz (93.7 kg)  08/02/18 209 lb (94.8 kg)     Health Maintenance Due  Topic Date Due  . HIV Screening  Never done     There are no preventive care reminders to display for this patient.  Lab Results  Component Value Date   TSH 1.570 08/02/2018   Lab Results  Component Value Date   WBC 5.1 08/02/2018   HGB 14.3 08/02/2018   HCT 42.0 08/02/2018   MCV 95 08/02/2018   PLT 213 08/02/2018   Lab Results  Component Value Date   NA 141 08/02/2018   K 4.6 08/02/2018   CO2 25 08/02/2018   GLUCOSE 100 (H) 08/02/2018   BUN 11 08/02/2018   CREATININE 1.10 08/02/2018   BILITOT 0.5 08/02/2018   ALKPHOS 89 08/02/2018   AST 33 08/02/2018   ALT 54 (H) 08/02/2018   PROT 6.6 08/02/2018   ALBUMIN 4.5 08/02/2018   CALCIUM 9.7 08/02/2018  ANIONGAP 12 01/03/2017   Lab Results  Component Value Date   CHOL 155 08/02/2018   Lab Results  Component Value Date   HDL 69 08/02/2018   Lab Results  Component Value Date   LDLCALC 62 08/02/2018   Lab Results  Component Value Date   TRIG 121 08/02/2018   Lab Results  Component Value Date   CHOLHDL 2.2 08/02/2018   Lab Results  Component Value Date   HGBA1C 4.8 03/07/2019      Assessment & Plan:   1. Essential hypertension He will continue to take medications as prescribed, to decrease high sodium intake, excessive alcohol intake, increase potassium intake, smoking cessation, and increase physical activity of at least 30 minutes of cardio activity daily. H will continue to follow Heart Healthy or DASH diet. - POCT urinalysis dipstick - CBC with Differential - Comprehensive metabolic panel - TSH - Lipid Panel - Vitamin B12 - Vitamin D, 25-hydroxy - amLODipine (NORVASC) 5 MG tablet; Take 1 tablet (5 mg total) by mouth daily.  Dispense: 30 tablet; Refill: 6 - lisinopril-hydrochlorothiazide (ZESTORETIC) 10-12.5 MG tablet; Take 1 tablet by mouth daily.  Dispense: 30 tablet; Refill: 6  2. Asymptomatic hypertensive urgency  3. Follow up He will follow up in 2 weeks for blood check only.  He will follow up in 3 months for office visit with Thad Ranger, NP.  Meds ordered this encounter  Medications  . amLODipine (NORVASC) 5 MG tablet    Sig: Take 1 tablet (5 mg total) by mouth daily.    Dispense:  30 tablet    Refill:  6  . lisinopril-hydrochlorothiazide (ZESTORETIC) 10-12.5 MG tablet    Sig: Take 1 tablet by mouth daily.    Dispense:  30 tablet    Refill:  6     Orders Placed This Encounter  Procedures  . CBC with Differential  . Comprehensive metabolic panel  . TSH  . Lipid Panel  . Vitamin B12  . Vitamin D, 25-hydroxy  . POCT urinalysis dipstick   Referral Orders  No referral(s) requested today    Raliegh Ip,  MSN, FNP-BC [facility] [address] [place], [place] [address] [phone] fax   Problem List Items Addressed This Visit      Cardiovascular and Mediastinum   Essential hypertension - Primary   Relevant Medications   amLODipine (NORVASC) 5 MG tablet   lisinopril-hydrochlorothiazide (ZESTORETIC) 10-12.5 MG tablet   Other Relevant Orders   POCT urinalysis dipstick (Completed)   CBC with Differential   Comprehensive metabolic panel   TSH   Lipid Panel   Vitamin B12   Vitamin D, 25-hydroxy    Other Visit Diagnoses    Asymptomatic hypertensive urgency       Relevant Medications   amLODipine (NORVASC) 5 MG tablet   lisinopril-hydrochlorothiazide (ZESTORETIC) 10-12.5 MG tablet   Follow up          Meds ordered this encounter  Medications  . amLODipine (NORVASC) 5 MG tablet    Sig: Take 1 tablet (5 mg total) by mouth daily.    Dispense:  30 tablet    Refill:  6  . lisinopril-hydrochlorothiazide (ZESTORETIC) 10-12.5 MG tablet    Sig: Take 1 tablet by mouth daily.    Dispense:  30 tablet    Refill:  6    Follow-up: No follow-ups on file.    Kallie Locks, FNP

## 2019-08-01 LAB — COMPREHENSIVE METABOLIC PANEL
ALT: 34 IU/L (ref 0–44)
AST: 34 IU/L (ref 0–40)
Albumin/Globulin Ratio: 2.2 (ref 1.2–2.2)
Albumin: 4.6 g/dL (ref 4.0–5.0)
Alkaline Phosphatase: 84 IU/L (ref 39–117)
BUN/Creatinine Ratio: 8 — ABNORMAL LOW (ref 9–20)
BUN: 9 mg/dL (ref 6–24)
Bilirubin Total: 1 mg/dL (ref 0.0–1.2)
CO2: 24 mmol/L (ref 20–29)
Calcium: 9.3 mg/dL (ref 8.7–10.2)
Chloride: 100 mmol/L (ref 96–106)
Creatinine, Ser: 1.18 mg/dL (ref 0.76–1.27)
GFR calc Af Amer: 84 mL/min/{1.73_m2} (ref >59)
GFR calc non Af Amer: 73 mL/min/{1.73_m2} (ref >59)
Globulin, Total: 2.1 g/dL (ref 1.5–4.5)
Glucose: 104 mg/dL — ABNORMAL HIGH (ref 65–99)
Potassium: 4.6 mmol/L (ref 3.5–5.2)
Sodium: 137 mmol/L (ref 134–144)
Total Protein: 6.7 g/dL (ref 6.0–8.5)

## 2019-08-01 LAB — CBC WITH DIFFERENTIAL/PLATELET
Basophils Absolute: 0.1 10*3/uL (ref 0.0–0.2)
Basos: 1 %
EOS (ABSOLUTE): 0.1 10*3/uL (ref 0.0–0.4)
Eos: 2 %
Hematocrit: 40.9 % (ref 37.5–51.0)
Hemoglobin: 14 g/dL (ref 13.0–17.7)
Immature Grans (Abs): 0 10*3/uL (ref 0.0–0.1)
Immature Granulocytes: 1 %
Lymphocytes Absolute: 1.2 10*3/uL (ref 0.7–3.1)
Lymphs: 21 %
MCH: 32.6 pg (ref 26.6–33.0)
MCHC: 34.2 g/dL (ref 31.5–35.7)
MCV: 95 fL (ref 79–97)
Monocytes Absolute: 0.5 10*3/uL (ref 0.1–0.9)
Monocytes: 8 %
Neutrophils Absolute: 3.9 10*3/uL (ref 1.4–7.0)
Neutrophils: 67 %
Platelets: 220 10*3/uL (ref 150–450)
RBC: 4.3 x10E6/uL (ref 4.14–5.80)
RDW: 11 % — ABNORMAL LOW (ref 11.6–15.4)
WBC: 5.8 10*3/uL (ref 3.4–10.8)

## 2019-08-01 LAB — VITAMIN B12: Vitamin B-12: 458 pg/mL (ref 232–1245)

## 2019-08-01 LAB — LIPID PANEL
Chol/HDL Ratio: 1.8 ratio (ref 0.0–5.0)
Cholesterol, Total: 158 mg/dL (ref 100–199)
HDL: 87 mg/dL (ref >39)
LDL Chol Calc (NIH): 57 mg/dL (ref 0–99)
Triglycerides: 74 mg/dL (ref 0–149)
VLDL Cholesterol Cal: 14 mg/dL (ref 5–40)

## 2019-08-01 LAB — TSH: TSH: 1.25 u[IU]/mL (ref 0.450–4.500)

## 2019-08-01 LAB — VITAMIN D 25 HYDROXY (VIT D DEFICIENCY, FRACTURES): Vit D, 25-Hydroxy: 6.4 ng/mL — ABNORMAL LOW (ref 30.0–100.0)

## 2019-08-07 ENCOUNTER — Other Ambulatory Visit: Payer: Self-pay | Admitting: Family Medicine

## 2019-08-07 DIAGNOSIS — E559 Vitamin D deficiency, unspecified: Secondary | ICD-10-CM

## 2019-08-07 MED ORDER — VITAMIN D (ERGOCALCIFEROL) 1.25 MG (50000 UNIT) PO CAPS: 50000.0000 [IU] | ORAL_CAPSULE | ORAL | 6 refills | Status: DC

## 2019-11-07 ENCOUNTER — Encounter: Payer: Self-pay | Admitting: Nurse Practitioner

## 2019-11-07 ENCOUNTER — Ambulatory Visit (INDEPENDENT_AMBULATORY_CARE_PROVIDER_SITE_OTHER): Payer: Commercial Managed Care - PPO | Admitting: Nurse Practitioner

## 2019-11-07 ENCOUNTER — Other Ambulatory Visit: Payer: Self-pay

## 2019-11-07 VITALS — BP 137/97 | HR 100 | Temp 97.4°F | Ht 72.0 in | Wt 204.0 lb

## 2019-11-07 DIAGNOSIS — Z113 Encounter for screening for infections with a predominantly sexual mode of transmission: Secondary | ICD-10-CM | POA: Diagnosis not present

## 2019-11-07 DIAGNOSIS — I10 Essential (primary) hypertension: Secondary | ICD-10-CM

## 2019-11-07 DIAGNOSIS — R8290 Unspecified abnormal findings in urine: Secondary | ICD-10-CM | POA: Diagnosis not present

## 2019-11-07 LAB — POCT URINALYSIS DIPSTICK
Glucose, UA: NEGATIVE
Ketones, UA: NEGATIVE
Leukocytes, UA: NEGATIVE
Nitrite, UA: NEGATIVE
Protein, UA: POSITIVE — AB
Spec Grav, UA: >=1.03 — AB (ref 1.010–1.025)
Urobilinogen, UA: 1 E.U./dL
pH, UA: 5.5 (ref 5.0–8.0)

## 2019-11-07 NOTE — Patient Instructions (Signed)
Steps to Quit Smoking Smoking tobacco is the leading cause of preventable death. It can affect almost every organ in the body. Smoking puts you and people around you at risk for many serious, long-lasting (chronic) diseases. Quitting smoking can be hard, but it is one of the best things that you can do for your health. It is never too late to quit. How do I get ready to quit? When you decide to quit smoking, make a plan to help you succeed. Before you quit:  Pick a date to quit. Set a date within the next 2 weeks to give you time to prepare.  Write down the reasons why you are quitting. Keep this list in places where you will see it often.  Tell your family, friends, and co-workers that you are quitting. Their support is important.  Talk with your doctor about the choices that may help you quit.  Find out if your health insurance will pay for these treatments.  Know the people, places, things, and activities that make you want to smoke (triggers). Avoid them. What first steps can I take to quit smoking?  Throw away all cigarettes at home, at work, and in your car.  Throw away the things that you use when you smoke, such as ashtrays and lighters.  Clean your car. Make sure to empty the ashtray.  Clean your home, including curtains and carpets. What can I do to help me quit smoking? Talk with your doctor about taking medicines and seeing a counselor at the same time. You are more likely to succeed when you do both.  If you are pregnant or breastfeeding, talk with your doctor about counseling or other ways to quit smoking. Do not take medicine to help you quit smoking unless your doctor tells you to do so. To quit smoking: Quit right away  Quit smoking totally, instead of slowly cutting back on how much you smoke over a period of time.  Go to counseling. You are more likely to quit if you go to counseling sessions regularly. Take medicine You may take medicines to help you quit. Some  medicines need a prescription, and some you can buy over-the-counter. Some medicines may contain a drug called nicotine to replace the nicotine in cigarettes. Medicines may:  Help you to stop having the desire to smoke (cravings).  Help to stop the problems that come when you stop smoking (withdrawal symptoms). Your doctor may ask you to use:  Nicotine patches, gum, or lozenges.  Nicotine inhalers or sprays.  Non-nicotine medicine that is taken by mouth. Find resources Find resources and other ways to help you quit smoking and remain smoke-free after you quit. These resources are most helpful when you use them often. They include:  Online chats with a counselor.  Phone quitlines.  Printed self-help materials.  Support groups or group counseling.  Text messaging programs.  Mobile phone apps. Use apps on your mobile phone or tablet that can help you stick to your quit plan. There are many free apps for mobile phones and tablets as well as websites. Examples include Quit Guide from the CDC and smokefree.gov  What things can I do to make it easier to quit?   Talk to your family and friends. Ask them to support and encourage you.  Call a phone quitline (1-800-QUIT-NOW), reach out to support groups, or work with a counselor.  Ask people who smoke to not smoke around you.  Avoid places that make you want to smoke,   such as: ? Bars. ? Parties. ? Smoke-break areas at work.  Spend time with people who do not smoke.  Lower the stress in your life. Stress can make you want to smoke. Try these things to help your stress: ? Getting regular exercise. ? Doing deep-breathing exercises. ? Doing yoga. ? Meditating. ? Doing a body scan. To do this, close your eyes, focus on one area of your body at a time from head to toe. Notice which parts of your body are tense. Try to relax the muscles in those areas. How will I feel when I quit smoking? Day 1 to 3 weeks Within the first 24 hours,  you may start to have some problems that come from quitting tobacco. These problems are very bad 2-3 days after you quit, but they do not often last for more than 2-3 weeks. You may get these symptoms:  Mood swings.  Feeling restless, nervous, angry, or annoyed.  Trouble concentrating.  Dizziness.  Strong desire for high-sugar foods and nicotine.  Weight gain.  Trouble pooping (constipation).  Feeling like you may vomit (nausea).  Coughing or a sore throat.  Changes in how the medicines that you take for other issues work in your body.  Depression.  Trouble sleeping (insomnia). Week 3 and afterward After the first 2-3 weeks of quitting, you may start to notice more positive results, such as:  Better sense of smell and taste.  Less coughing and sore throat.  Slower heart rate.  Lower blood pressure.  Clearer skin.  Better breathing.  Fewer sick days. Quitting smoking can be hard. Do not give up if you fail the first time. Some people need to try a few times before they succeed. Do your best to stick to your quit plan, and talk with your doctor if you have any questions or concerns. Summary  Smoking tobacco is the leading cause of preventable death. Quitting smoking can be hard, but it is one of the best things that you can do for your health.  When you decide to quit smoking, make a plan to help you succeed.  Quit smoking right away, not slowly over a period of time.  When you start quitting, seek help from your doctor, family, or friends. This information is not intended to replace advice given to you by your health care provider. Make sure you discuss any questions you have with your health care provider. Document Revised: 01/12/2019 Document Reviewed: 07/08/2018 Elsevier Patient Education  2020 Elsevier Inc.  

## 2019-11-07 NOTE — Progress Notes (Signed)
[facility] [address] [place], [place]  [address] Phone:  [phone]   Fax:  [phone]    Established Patient Office Visit  Subjective:  Patient ID: Joshua Blake., male    DOB: 04-16-1971  Age: 49 y.o. MRN: [id number]  CC:  Chief Complaint  Patient presents with  . Follow-up    Reuqesiting STD Testing    HPI Alphonzo Devera. presents for follow up. He  has a past medical history of Frequency of urination and polyuria (02/27/2015), Hypertension, Libido, decreased (04/22/2016), Palpitations (02/15/2017), Testosterone deficiency in male, and Tobacco dependence (04/03/2015).   Hypertension Patient is here for follow-up of elevated blood pressure. He is exercising and is adherent to a low-salt diet. Blood pressure is not monitor at home. Cardiac symptoms: none. Patient denies chest pain, claudication, dyspnea, fatigue, irregular heart beat, lower extremity edema and palpitations. Cardiovascular risk factors: male gender and smoking/ tobacco exposure. Use of agents associated with hypertension: none. History of target organ damage: none.  He is requesting STD care.  He denies any exposures or current symptoms.Denies oral lesions,, sore throat, pelvic pain, penile discharge, dysuria, nocturia any visible ulcers or lesions  Past Medical History:  Diagnosis Date  . Frequency of urination and polyuria 02/27/2015  . Hypertension   . Libido, decreased 04/22/2016  . Palpitations 02/15/2017  . Testosterone deficiency in male   . Tobacco dependence 04/03/2015    Past Surgical History:  Procedure Laterality Date  . FOOT SURGERY      Family History  Problem Relation Age of Onset  . Hypertension Mother   . Hypertension Father   . Diabetes Maternal Grandmother     Social History   Socioeconomic History  . Marital status: Married    Spouse name: Not on file  . Number of children: Not on file  . Years of education: Not on file  . Highest  education level: Not on file  Occupational History  . Not on file  Tobacco Use  . Smoking status: Current Every Day Smoker    Packs/day: 1.50  . Smokeless tobacco: Current User  Vaping Use  . Vaping Use: Never used  Substance and Sexual Activity  . Alcohol use: Yes    Alcohol/week: 42.0 standard drinks    Types: 42 Cans of beer per week    Comment: daily  . Drug use: No  . Sexual activity: Yes  Other Topics Concern  . Not on file  Social History Narrative  . Not on file   Social Determinants of Health   Financial Resource Strain:   . Difficulty of Paying Living Expenses:   Food Insecurity:   . Worried About Programme researcher, broadcasting/film/video in the Last Year:   . Barista in the Last Year:   Transportation Needs:   . Freight forwarder (Medical):   Marland Kitchen Lack of Transportation (Non-Medical):   Physical Activity:   . Days of Exercise per Week:   . Minutes of Exercise per Session:   Stress:   . Feeling of Stress :   Social Connections:   . Frequency of Communication with Friends and Family:   . Frequency of Social Gatherings with Friends and Family:   . Attends Religious Services:   . Active Member of Clubs or Organizations:   . Attends Banker Meetings:   Marland Kitchen Marital Status:   Intimate Partner Violence:   . Fear of Current or Ex-Partner:   .  Emotionally Abused:   Marland Kitchen Physically Abused:   . Sexually Abused:     Outpatient Medications Prior to Visit  Medication Sig Dispense Refill  . acetaminophen (TYLENOL) 500 MG tablet Take 1 tablet (500 mg total) by mouth 2 (two) times daily. 60 tablet 3  . amLODipine (NORVASC) 5 MG tablet Take 1 tablet (5 mg total) by mouth daily. 30 tablet 6  . lisinopril-hydrochlorothiazide (ZESTORETIC) 10-12.5 MG tablet Take 1 tablet by mouth daily. 30 tablet 6  . Vitamin D, Ergocalciferol, (DRISDOL) 1.25 MG (50000 UNIT) CAPS capsule Take 1 capsule (50,000 Units total) by mouth every 7 (seven) days. 5 capsule 6  . Testosterone 40.5  MG/2.5GM (1.62%) GEL Apply 40.5 mg daily (2 pumps) daily to shoulder(s) or upper arms(s). (Patient not taking: Reported on 07/31/2019) 2.5 g 1   Facility-Administered Medications Prior to Visit  Medication Dose Route Frequency Provider Last Rate Last Admin  . cloNIDine (CATAPRES) tablet 0.3 mg  0.3 mg Oral Once Kallie Locks, FNP        No Known Allergies  ROS Review of Systems    Objective:    Physical Exam Vitals reviewed.  Constitutional:      General: He is not in acute distress.    Appearance: Normal appearance. He is normal weight. He is not ill-appearing or toxic-appearing.  HENT:     Head: Normocephalic and atraumatic.     Nose: Nose normal.     Mouth/Throat:     Mouth: Mucous membranes are moist.  Cardiovascular:     Rate and Rhythm: Normal rate and regular rhythm.     Pulses: Normal pulses.     Heart sounds: Normal heart sounds.  Pulmonary:     Effort: Pulmonary effort is normal.     Breath sounds: Normal breath sounds.  Abdominal:     General: Bowel sounds are normal.     Palpations: Abdomen is soft.  Musculoskeletal:        General: Normal range of motion.     Cervical back: Normal range of motion.  Skin:    General: Skin is warm.     Capillary Refill: Capillary refill takes less than 2 seconds.  Neurological:     General: No focal deficit present.     Mental Status: He is alert and oriented to person, place, and time.  Psychiatric:        Mood and Affect: Mood normal.        Behavior: Behavior normal.        Thought Content: Thought content normal.        Judgment: Judgment normal.     BP (!) 137/97   Pulse 100   Temp (!) 97.4 F (36.3 C)   Ht 6' (1.829 m)   Wt 204 lb (92.5 kg)   SpO2 98%   BMI 27.67 kg/m  Wt Readings from Last 3 Encounters:  11/07/19 204 lb (92.5 kg)  07/31/19 208 lb 12.8 oz (94.7 kg)  03/07/19 206 lb 9.6 oz (93.7 kg)     Health Maintenance Due  Topic Date Due  . COVID-19 Vaccine (1) Never done    There are no  preventive care reminders to display for this patient.  Lab Results  Component Value Date   TSH 1.250 07/31/2019   Lab Results  Component Value Date   WBC 5.8 07/31/2019   HGB 14.0 07/31/2019   HCT 40.9 07/31/2019   MCV 95 07/31/2019   PLT 220 07/31/2019   Lab Results  Component Value Date   NA 140 11/07/2019   K 4.5 11/07/2019   CO2 24 07/31/2019   GLUCOSE 106 (H) 11/07/2019   BUN 10 11/07/2019   CREATININE 1.04 11/07/2019   BILITOT 0.4 11/07/2019   ALKPHOS 85 11/07/2019   AST 84 (H) 11/07/2019   ALT 34 07/31/2019   PROT 7.4 11/07/2019   ALBUMIN 4.9 11/07/2019   CALCIUM 9.5 11/07/2019   ANIONGAP 12 01/03/2017   Lab Results  Component Value Date   CHOL 158 07/31/2019   Lab Results  Component Value Date   HDL 87 07/31/2019   Lab Results  Component Value Date   LDLCALC 57 07/31/2019   Lab Results  Component Value Date   TRIG 74 07/31/2019   Lab Results  Component Value Date   CHOLHDL 1.8 07/31/2019   Lab Results  Component Value Date   HGBA1C 4.8 03/07/2019      Assessment & Plan:   Problem List Items Addressed This Visit      Cardiovascular and Mediastinum   Essential hypertension   Relevant Orders   Urinalysis Dipstick (Completed)   Comp. Metabolic Panel (12) (Completed) We will continue with current regimen. Encourage smoking cessation Encourage regular evaluation of blood pressure outside of office Encourage lifestyle modification with low-salt diet and exercise    Other Visit Diagnoses    Screening for STD (sexually transmitted disease)    -  Primary   Relevant Orders   STD Screen (8) (Completed)   Chlamydia/Gonococcus/Trichomonas, NAA (Completed)   HIV antibody (with reflex) Declined condoms   Abnormal urinalysis       Relevant Orders   Urine Culture (Completed)      No orders of the defined types were placed in this encounter.   Follow-up: Return in about 3 months (around 02/07/2020).    Barbette Merino, NP

## 2019-11-08 LAB — STD SCREEN (8)
HIV Screen 4th Generation wRfx: NONREACTIVE
HSV 1 Glycoprotein G Ab, IgG: 22.3 index — ABNORMAL HIGH (ref 0.00–0.90)
HSV 2 IgG, Type Spec: <0.91 index (ref 0.00–0.90)
Hep A IgM: NEGATIVE
Hep B C IgM: NEGATIVE
Hep C Virus Ab: <0.1 s/co ratio (ref 0.0–0.9)
Hepatitis B Surface Ag: NEGATIVE
RPR Ser Ql: NONREACTIVE

## 2019-11-08 LAB — COMP. METABOLIC PANEL (12)
AST: 84 IU/L — ABNORMAL HIGH (ref 0–40)
Albumin/Globulin Ratio: 2 (ref 1.2–2.2)
Albumin: 4.9 g/dL (ref 4.0–5.0)
Alkaline Phosphatase: 85 IU/L (ref 48–121)
BUN/Creatinine Ratio: 10 (ref 9–20)
BUN: 10 mg/dL (ref 6–24)
Bilirubin Total: 0.4 mg/dL (ref 0.0–1.2)
Calcium: 9.5 mg/dL (ref 8.7–10.2)
Chloride: 101 mmol/L (ref 96–106)
Creatinine, Ser: 1.04 mg/dL (ref 0.76–1.27)
GFR calc Af Amer: 98 mL/min/{1.73_m2} (ref >59)
GFR calc non Af Amer: 85 mL/min/{1.73_m2} (ref >59)
Globulin, Total: 2.5 g/dL (ref 1.5–4.5)
Glucose: 106 mg/dL — ABNORMAL HIGH (ref 65–99)
Potassium: 4.5 mmol/L (ref 3.5–5.2)
Sodium: 140 mmol/L (ref 134–144)
Total Protein: 7.4 g/dL (ref 6.0–8.5)

## 2019-11-09 LAB — URINE CULTURE: Organism ID, Bacteria: NO GROWTH

## 2019-11-10 LAB — CHLAMYDIA/GONOCOCCUS/TRICHOMONAS, NAA
Chlamydia by NAA: NEGATIVE
Gonococcus by NAA: NEGATIVE
Trich vag by NAA: NEGATIVE

## 2019-11-15 ENCOUNTER — Other Ambulatory Visit: Payer: Self-pay | Admitting: Nurse Practitioner

## 2019-11-15 ENCOUNTER — Telehealth: Payer: Self-pay | Admitting: Nurse Practitioner

## 2019-11-15 DIAGNOSIS — E559 Vitamin D deficiency, unspecified: Secondary | ICD-10-CM

## 2019-11-15 DIAGNOSIS — I10 Essential (primary) hypertension: Secondary | ICD-10-CM

## 2019-11-15 MED ORDER — AMLODIPINE BESYLATE 5 MG PO TABS: 5.0000 mg | ORAL_TABLET | Freq: Every day | ORAL | 6 refills | Status: DC

## 2019-11-15 MED ORDER — VITAMIN D (ERGOCALCIFEROL) 1.25 MG (50000 UNIT) PO CAPS: 50000.0000 [IU] | ORAL_CAPSULE | ORAL | 6 refills | Status: DC

## 2019-11-15 MED ORDER — LISINOPRIL-HYDROCHLOROTHIAZIDE 10-12.5 MG PO TABS: 1.0000 | ORAL_TABLET | Freq: Every day | ORAL | 6 refills | Status: DC

## 2019-11-15 NOTE — Telephone Encounter (Signed)
Spoke with patient Amlodipine, Lisinopril and Vitamin D have been sent to [facility].

## 2020-02-07 ENCOUNTER — Ambulatory Visit (INDEPENDENT_AMBULATORY_CARE_PROVIDER_SITE_OTHER): Payer: Commercial Managed Care - PPO | Admitting: Nurse Practitioner

## 2020-02-07 ENCOUNTER — Other Ambulatory Visit: Payer: Self-pay

## 2020-02-07 ENCOUNTER — Encounter: Payer: Self-pay | Admitting: Nurse Practitioner

## 2020-02-07 VITALS — BP 137/100 | HR 78 | Temp 98.6°F | Ht 72.0 in | Wt 199.0 lb

## 2020-02-07 DIAGNOSIS — E559 Vitamin D deficiency, unspecified: Secondary | ICD-10-CM

## 2020-02-07 DIAGNOSIS — Z716 Tobacco abuse counseling: Secondary | ICD-10-CM | POA: Diagnosis not present

## 2020-02-07 DIAGNOSIS — F1010 Alcohol abuse, uncomplicated: Secondary | ICD-10-CM

## 2020-02-07 DIAGNOSIS — I10 Essential (primary) hypertension: Secondary | ICD-10-CM | POA: Diagnosis not present

## 2020-02-07 DIAGNOSIS — Z13220 Encounter for screening for lipoid disorders: Secondary | ICD-10-CM

## 2020-02-07 DIAGNOSIS — N529 Male erectile dysfunction, unspecified: Secondary | ICD-10-CM

## 2020-02-07 LAB — POCT URINALYSIS DIPSTICK (MANUAL)
Leukocytes, UA: NEGATIVE
Nitrite, UA: NEGATIVE
Poct Bilirubin: NEGATIVE
Poct Glucose: NORMAL mg/dL
Poct Protein: NEGATIVE mg/dL
Poct Urobilinogen: NORMAL mg/dL
Spec Grav, UA: 1.02 (ref 1.010–1.025)
pH, UA: 5 (ref 5.0–8.0)

## 2020-02-07 MED ORDER — BUPROPION HCL ER (SR) 150 MG PO TB12: 150.0000 mg | ORAL_TABLET | Freq: Two times a day (BID) | ORAL | 3 refills | Status: DC

## 2020-02-07 MED ORDER — AMLODIPINE BESYLATE 5 MG PO TABS: 5.0000 mg | ORAL_TABLET | Freq: Every day | ORAL | 3 refills | Status: DC

## 2020-02-07 MED ORDER — LISINOPRIL-HYDROCHLOROTHIAZIDE 10-12.5 MG PO TABS: 1.0000 | ORAL_TABLET | Freq: Every day | ORAL | 3 refills | Status: DC

## 2020-02-07 MED ORDER — NICOTINE 21 MG/24HR TD PT24: 21.0000 mg | MEDICATED_PATCH | Freq: Every day | TRANSDERMAL | 0 refills | Status: DC

## 2020-02-07 MED ORDER — VITAMIN D (ERGOCALCIFEROL) 1.25 MG (50000 UNIT) PO CAPS: 50000.0000 [IU] | ORAL_CAPSULE | ORAL | 3 refills | Status: DC

## 2020-02-07 NOTE — Patient Instructions (Signed)
Alcohol Abuse and Dependence Information, Adult Alcohol is a widely available drug. People drink alcohol in different amounts. People who drink alcohol very often and in large amounts often have problems during and after drinking. They may develop what is called an alcohol use disorder. There are two main types of alcohol use disorders:  Alcohol abuse. This is when you use alcohol too much or too often. You may use alcohol to make yourself feel happy or to reduce stress. You may have a hard time setting a limit on the amount you drink.  Alcohol dependence. This is when you use alcohol consistently for a period of time, and your body changes as a result. This can make it hard to stop drinking because you may start to feel sick or feel different when you do not use alcohol. These symptoms are known as withdrawal. How can alcohol abuse and dependence affect me? Alcohol abuse and dependence can have a negative effect on your life. Drinking too much can lead to addiction. You may feel like you need alcohol to function normally. You may drink alcohol before work in the morning, during the day, or as soon as you get home from work in the evening. These actions can result in:  Poor work performance.  Job loss.  Financial problems.  Car crashes or criminal charges from driving after drinking alcohol.  Problems in your relationships with friends and family.  Losing the trust and respect of coworkers, friends, and family. Drinking heavily over a long period of time can permanently damage your body and brain, and can cause lifelong health issues, such as:  Damage to your liver or pancreas.  Heart problems, high blood pressure, or stroke.  Certain cancers.  Decreased ability to fight infections.  Brain or nerve damage.  Depression.  Early (premature) death. If you are careless or you crave alcohol, it is easy to drink more than your body can handle (overdose). Alcohol overdose is a serious  situation that requires hospitalization. It may lead to permanent injuries or death. What can increase my risk?  Having a family history of alcohol abuse.  Having depression or other mental health conditions.  Beginning to drink at an early age.  Binge drinking often.  Experiencing trauma, stress, and an unstable home life during childhood.  Spending time with people who drink often. What actions can I take to prevent or manage alcohol abuse and dependence?  Do not drink alcohol if: ? Your health care provider tells you not to drink. ? You are pregnant, may be pregnant, or are planning to become pregnant.  If you drink alcohol: ? Limit how much you use to:  0-1 drink a day for women.  0-2 drinks a day for men. ? Be aware of how much alcohol is in your drink. In the [place] one drink equals one 12 oz bottle of beer (355 mL), one 5 oz glass of wine (148 mL), or one 1 oz glass of hard liquor (44 mL).  Stop drinking if you have been drinking too much. This can be very hard to do if you are used to abusing alcohol. If you begin to have withdrawal symptoms, talk with your health care provider or a person that you trust. These symptoms may include anxiety, shaky hands, headache, nausea, sweating, or not being able to sleep.  Choose to drink nonalcoholic beverages in social gatherings and places where there may be alcohol. Activity  Spend more time on activities that you enjoy that do   not involve alcohol, like hobbies or exercise.  Find healthy ways to cope with stress, such as exercise, meditation, or spending time with people you care about. General information  Talk to your family, coworkers, and friends about supporting you in your efforts to stop drinking. If they drink, ask them not to drink around you. Spend more time with people who do not drink alcohol.  If you think that you have an alcohol dependency problem: ? Tell friends or family about your concerns. ? Talk with your  health care provider or another health professional about where to get help. ? Work with a Paramedic and a Network engineer. ? Consider joining a support group for people who struggle with alcohol abuse and dependence. Where to find support   Your health care provider.  SMART Recovery: www.smartrecovery.org Therapy and support groups  Local treatment centers or chemical dependency counselors.  Local AA groups in your community: SalaryStart.tn Where to find more information  Centers for Disease Control and Prevention: FootballExhibition.com.br  [employer] on Alcohol Abuse and Alcoholism: BasicStudents.dk  Alcoholics Anonymous (AA): SalaryStart.tn Contact a health care provider if:  You drank more or for longer than you intended on more than one occasion.  You tried to stop drinking or to cut back on how much you drink, but you were not able to.  You often drink to the point of vomiting or passing out.  You want to drink so badly that you cannot think about anything else.  You have problems in your life due to drinking, but you continue to drink.  You keep drinking even though you feel anxious, depressed, or have experienced memory loss.  You have stopped doing the things you used to enjoy in order to drink.  You have to drink more than you used to in order to get the effect you want.  You experience anxiety, sweating, nausea, shakiness, and trouble sleeping when you try to stop drinking. Get help right away if:  You have thoughts about hurting yourself or others.  You have serious withdrawal symptoms, including: ? Confusion. ? Racing heart. ? High blood pressure. ? Fever. If you ever feel like you may hurt yourself or others, or have thoughts about taking your own life, get help right away. You can go to your nearest emergency department or call:  Your local emergency services (911 in the [place]  A suicide crisis helpline, such as the National Suicide Prevention  Lifeline at 346-742-4393. This is open 24 hours a day. Summary  Alcohol abuse and dependence can have a negative effect on your life. Drinking too much or too often can lead to addiction.  If you drink alcohol, limit how much you use.  If you are having trouble keeping your drinking under control, find ways to change your behavior. Hobbies, calming activities, exercise, or support groups can help.  If you feel you need help with changing your drinking habits, talk with your health care provider, a good friend, or a therapist, or go to an AA group. This information is not intended to replace advice given to you by your health care provider. Make sure you discuss any questions you have with your health care provider. Document Revised: 08/08/2018 Document Reviewed: 06/27/2018 Elsevier Patient Education  2020 [employer].  Steps to Quit Smoking Smoking tobacco is the leading cause of preventable death. It can affect almost every organ in the body. Smoking puts you and people around you at risk for many serious, long-lasting (chronic)  diseases. Quitting smoking can be hard, but it is one of the best things that you can do for your health. It is never too late to quit. How do I get ready to quit? When you decide to quit smoking, make a plan to help you succeed. Before you quit:  Pick a date to quit. Set a date within the next 2 weeks to give you time to prepare.  Write down the reasons why you are quitting. Keep this list in places where you will see it often.  Tell your family, friends, and co-workers that you are quitting. Their support is important.  Talk with your doctor about the choices that may help you quit.  Find out if your health insurance will pay for these treatments.  Know the people, places, things, and activities that make you want to smoke (triggers). Avoid them. What first steps can I take to quit smoking?  Throw away all cigarettes at home, at work, and in your  car.  Throw away the things that you use when you smoke, such as ashtrays and lighters.  Clean your car. Make sure to empty the ashtray.  Clean your home, including curtains and carpets. What can I do to help me quit smoking? Talk with your doctor about taking medicines and seeing a counselor at the same time. You are more likely to succeed when you do both.  If you are pregnant or breastfeeding, talk with your doctor about counseling or other ways to quit smoking. Do not take medicine to help you quit smoking unless your doctor tells you to do so. To quit smoking: Quit right away  Quit smoking totally, instead of slowly cutting back on how much you smoke over a period of time.  Go to counseling. You are more likely to quit if you go to counseling sessions regularly. Take medicine You may take medicines to help you quit. Some medicines need a prescription, and some you can buy over-the-counter. Some medicines may contain a drug called nicotine to replace the nicotine in cigarettes. Medicines may:  Help you to stop having the desire to smoke (cravings).  Help to stop the problems that come when you stop smoking (withdrawal symptoms). Your doctor may ask you to use:  Nicotine patches, gum, or lozenges.  Nicotine inhalers or sprays.  Non-nicotine medicine that is taken by mouth. Find resources Find resources and other ways to help you quit smoking and remain smoke-free after you quit. These resources are most helpful when you use them often. They include:  Online chats with a Veterinary surgeon.  Phone quitlines.  Printed Materials engineer.  Support groups or group counseling.  Text messaging programs.  Mobile phone apps. Use apps on your mobile phone or tablet that can help you stick to your quit plan. There are many free apps for mobile phones and tablets as well as websites. Examples include Quit Guide from the [employer] and smokefree.gov  What things can I do to make it easier to  quit?   Talk to your family and friends. Ask them to support and encourage you.  Call a phone quitline (1-800-QUIT-NOW), reach out to support groups, or work with a Veterinary surgeon.  Ask people who smoke to not smoke around you.  Avoid places that make you want to smoke, such as: ? Bars. ? Parties. ? Smoke-break areas at work.  Spend time with people who do not smoke.  Lower the stress in your life. Stress can make you want to smoke. Try  these things to help your stress: ? Getting regular exercise. ? Doing deep-breathing exercises. ? Doing yoga. ? Meditating. ? Doing a body scan. To do this, close your eyes, focus on one area of your body at a time from head to toe. Notice which parts of your body are tense. Try to relax the muscles in those areas. How will I feel when I quit smoking? Day 1 to 3 weeks Within the first 24 hours, you may start to have some problems that come from quitting tobacco. These problems are very bad 2-3 days after you quit, but they do not often last for more than 2-3 weeks. You may get these symptoms:  Mood swings.  Feeling restless, nervous, angry, or annoyed.  Trouble concentrating.  Dizziness.  Strong desire for high-sugar foods and nicotine.  Weight gain.  Trouble pooping (constipation).  Feeling like you may vomit (nausea).  Coughing or a sore throat.  Changes in how the medicines that you take for other issues work in your body.  Depression.  Trouble sleeping (insomnia). Week 3 and afterward After the first 2-3 weeks of quitting, you may start to notice more positive results, such as:  Better sense of smell and taste.  Less coughing and sore throat.  Slower heart rate.  Lower blood pressure.  Clearer skin.  Better breathing.  Fewer sick days. Quitting smoking can be hard. Do not give up if you fail the first time. Some people need to try a few times before they succeed. Do your best to stick to your quit plan, and talk with  your doctor if you have any questions or concerns. Summary  Smoking tobacco is the leading cause of preventable death. Quitting smoking can be hard, but it is one of the best things that you can do for your health.  When you decide to quit smoking, make a plan to help you succeed.  Quit smoking right away, not slowly over a period of time.  When you start quitting, seek help from your doctor, family, or friends. This information is not intended to replace advice given to you by your health care provider. Make sure you discuss any questions you have with your health care provider. Document Revised: 01/12/2019 Document Reviewed: 07/08/2018 Elsevier Patient Education  2020 [employer].

## 2020-02-07 NOTE — Progress Notes (Signed)
[facility] Patient [facility] [address] [place], [place]  [address] Phone:  [phone]   Fax:  [phone]   Established Patient Office Visit  Subjective:  Patient ID: Joshua Klinefelter., male    DOB: 1971/03/19  Age: 49 y.o. MRN: [id number]  CC:  Chief Complaint  Patient presents with   Follow-up    no problems, wants to discuss having smoking cesession patch, wants to get help to stop drinking so much, also need a Viagra script    HPI Joshua Blake. presents for follow up. He  has a past medical history of Frequency of urination and polyuria (02/27/2015), Hypertension, Libido, decreased (04/22/2016), Palpitations (02/15/2017), Testosterone deficiency in male, and Tobacco dependence (04/03/2015).   Hypertension Patient is here for follow-up of elevated blood pressure. He is active and is not adherent to a low-salt diet. Cardiac symptoms: none. Patient denies chest pain, dyspnea, exertional chest pressure/discomfort, fatigue, irregular heart beat and palpitations. Cardiovascular risk factors: hypertension, male gender and smoking/ tobacco exposure. Use of agents associated with hypertension: none. History of target organ damage: none.  Admits that he did not take his medicine this morning and smoked prior to coming to his.  He is also here for evaluation and treatment of erectile dysfunction. Onset of dysfunction was several months ago and onset was gradual. Patient states he has difficulty both attaining and maintaining erection. Full erections occur with intercourse. Libido is affected. Risk factors for ED include antihypertensive medications: Amlodipine and lisinopril HCTZ. Patient denies history of cranial, spinal, or pelvic trauma, diabetes mellitus, hypogonadism and pelvic radiation. Patient's expectations of sexual function is to be normal patient describes relationship with partner as okay.  He is requesting Viagra.  He was on AndroGel in the past.  Past Medical  History:  Diagnosis Date   Frequency of urination and polyuria 02/27/2015   Hypertension    Libido, decreased 04/22/2016   Palpitations 02/15/2017   Testosterone deficiency in male    Tobacco dependence 04/03/2015    Past Surgical History:  Procedure Laterality Date   FOOT SURGERY      Family History  Problem Relation Age of Onset   Hypertension Mother    Hypertension Father    Diabetes Maternal Grandmother     Social History   Socioeconomic History   Marital status: Married    Spouse name: Not on file   Number of children: Not on file   Years of education: Not on file   Highest education level: Not on file  Occupational History   Not on file  Tobacco Use   Smoking status: Current Every Day Smoker    Packs/day: 1.50   Smokeless tobacco: Current User  Vaping Use   Vaping Use: Never used  Substance and Sexual Activity   Alcohol use: Yes    Alcohol/week: 42.0 standard drinks    Types: 42 Cans of beer per week    Comment: daily   Drug use: No   Sexual activity: Yes  Other Topics Concern   Not on file  Social History Narrative   Not on file   Social Determinants of Health   Financial Resource Strain:    Difficulty of Paying Living Expenses: Not on file  Food Insecurity:    Worried About Running Out of Food in the Last Year: Not on file   Ran Out of Food in the Last Year: Not on file  Transportation Needs:    Lack of Transportation (Medical): Not on  file   Lack of Transportation (Non-Medical): Not on file  Physical Activity:    Days of Exercise per Week: Not on file   Minutes of Exercise per Session: Not on file  Stress:    Feeling of Stress : Not on file  Social Connections:    Frequency of Communication with Friends and Family: Not on file   Frequency of Social Gatherings with Friends and Family: Not on file   Attends Religious Services: Not on file   Active Member of Clubs or Organizations: Not on file   Attends  Banker Meetings: Not on file   Marital Status: Not on file  Intimate Partner Violence:    Fear of Current or Ex-Partner: Not on file   Emotionally Abused: Not on file   Physically Abused: Not on file   Sexually Abused: Not on file    Outpatient Medications Prior to Visit  Medication Sig Dispense Refill   acetaminophen (TYLENOL) 500 MG tablet Take 1 tablet (500 mg total) by mouth 2 (two) times daily. 60 tablet 3   amLODipine (NORVASC) 5 MG tablet Take 1 tablet (5 mg total) by mouth daily. 30 tablet 6   lisinopril-hydrochlorothiazide (ZESTORETIC) 10-12.5 MG tablet Take 1 tablet by mouth daily. 30 tablet 6   Vitamin D, Ergocalciferol, (DRISDOL) 1.25 MG (50000 UNIT) CAPS capsule Take 1 capsule (50,000 Units total) by mouth every 7 (seven) days. 5 capsule 6   Facility-Administered Medications Prior to Visit  Medication Dose Route Frequency Provider Last Rate Last Admin   cloNIDine (CATAPRES) tablet 0.3 mg  0.3 mg Oral Once Kallie Locks, FNP        No Known Allergies  ROS Review of Systems  Constitutional: Negative.   HENT: Negative.   Eyes: Negative.   Respiratory: Negative for shortness of breath.   Cardiovascular: Negative for chest pain and leg swelling.  Neurological: Negative for dizziness and headaches.      Objective:    Physical Exam Constitutional:      General: He is not in acute distress.    Appearance: He is not ill-appearing, toxic-appearing or diaphoretic.  HENT:     Head: Normocephalic and atraumatic.     Nose: Nose normal.     Mouth/Throat:     Mouth: Mucous membranes are moist.  Cardiovascular:     Rate and Rhythm: Normal rate.     Pulses: Normal pulses.  Pulmonary:     Effort: Pulmonary effort is normal.     Breath sounds: Normal breath sounds.  Abdominal:     Palpations: Abdomen is soft.  Musculoskeletal:        General: Normal range of motion.     Cervical back: Normal range of motion.  Skin:    General: Skin is warm  and dry.     Capillary Refill: Capillary refill takes less than 2 seconds.  Neurological:     General: No focal deficit present.     Mental Status: He is alert and oriented to person, place, and time.  Psychiatric:        Mood and Affect: Mood normal.        Behavior: Behavior normal.        Thought Content: Thought content normal.        Judgment: Judgment normal.     BP (!) 137/100 (BP Location: Right Arm, Patient Position: Sitting, Cuff Size: Normal)    Pulse 78    Temp 98.6 F (37 C) (Temporal)    Ht  6' (1.829 m)    Wt 199 lb (90.3 kg)    SpO2 99%    BMI 26.99 kg/m  Wt Readings from Last 3 Encounters:  02/07/20 199 lb (90.3 kg)  11/07/19 204 lb (92.5 kg)  07/31/19 208 lb 12.8 oz (94.7 kg)     Health Maintenance Due  Topic Date Due   COVID-19 Vaccine (1) Never done    There are no preventive care reminders to display for this patient.  Lab Results  Component Value Date   TSH 1.250 07/31/2019   Lab Results  Component Value Date   WBC 5.8 07/31/2019   HGB 14.0 07/31/2019   HCT 40.9 07/31/2019   MCV 95 07/31/2019   PLT 220 07/31/2019   Lab Results  Component Value Date   NA 141 02/07/2020   K 4.8 02/07/2020   CO2 24 07/31/2019   GLUCOSE 93 02/07/2020   BUN 8 02/07/2020   CREATININE 1.09 02/07/2020   BILITOT 1.0 02/07/2020   ALKPHOS 88 02/07/2020   AST 91 (H) 02/07/2020   ALT 34 07/31/2019   PROT 7.1 02/07/2020   ALBUMIN 4.8 02/07/2020   CALCIUM 10.0 02/07/2020   ANIONGAP 12 01/03/2017   Lab Results  Component Value Date   CHOL 179 02/07/2020   Lab Results  Component Value Date   HDL 101 02/07/2020   Lab Results  Component Value Date   LDLCALC 65 02/07/2020   Lab Results  Component Value Date   TRIG 72 02/07/2020   Lab Results  Component Value Date   CHOLHDL 1.8 02/07/2020   Lab Results  Component Value Date   HGBA1C 4.8 03/07/2019      Assessment & Plan:   Problem List Items Addressed This Visit      Cardiovascular and  Mediastinum   Essential hypertension Encouraged on going compliance with current medication regimen Encouraged home monitoring and recording BP <130/80 Eating a heart-healthy diet with less salt Encouraged regular physical activity  Recommend Weight loss    Relevant Medications   lisinopril-hydrochlorothiazide (ZESTORETIC) 10-12.5 MG tablet   amLODipine (NORVASC) 5 MG tablet   Other Relevant Orders   Comp. Metabolic Panel (12) (Completed)    Other Visit Diagnoses    Encounter for smoking cessation counseling    -  Primary Discussed the risk factors associated with smoking ; CAD, COPD, Cancer, PVD increased susceptibility to respiratory illnesses Discussed treatment options with cessation ie counseling, support resources and available medications  Choosing a quit day and setting goals accordingly. Discussed ways to quit; start by decreasing one cigarette per day or per week.  Started on Nicoderm patch  Zyban 150 mg BID. Discussed potential side effects.  Provided education handouts   Counseling 5-10 minutes    Relevant Medications   nicotine (NICODERM CQ) 21 mg/24hr patch   Vitamin D deficiency       Relevant Medications   Vitamin D, Ergocalciferol, (DRISDOL) 1.25 MG (50000 UNIT) CAPS capsule   Alcohol abuse     Hopefully trial on Wellbutrin will help tobacco and alcohol abuse   Relevant Orders   POCT Urinalysis Dip Manual (Completed)   Erectile dysfunction, unspecified erectile dysfunction type       Relevant Orders   PSA (Completed)   Screening for cholesterol level       Relevant Orders   Lipid panel (Completed)      Meds ordered this encounter  Medications   lisinopril-hydrochlorothiazide (ZESTORETIC) 10-12.5 MG tablet    Sig: Take 1 tablet by  mouth daily.    Dispense:  90 tablet    Refill:  3   Vitamin D, Ergocalciferol, (DRISDOL) 1.25 MG (50000 UNIT) CAPS capsule    Sig: Take 1 capsule (50,000 Units total) by mouth every 7 (seven) days.    Dispense:  12 capsule     Refill:  3   amLODipine (NORVASC) 5 MG tablet    Sig: Take 1 tablet (5 mg total) by mouth daily.    Dispense:  90 tablet    Refill:  3   nicotine (NICODERM CQ) 21 mg/24hr patch    Sig: Place 1 patch (21 mg total) onto the skin daily.    Dispense:  28 patch    Refill:  0    Order Specific Question:   Supervising Provider    Answer:   Quentin AngstJEGEDE, OLUGBEMIGA E [[id number]]   buPROPion (WELLBUTRIN SR) 150 MG 12 hr tablet    Sig: Take 1 tablet (150 mg total) by mouth 2 (two) times daily.    Dispense:  180 tablet    Refill:  3    Order Specific Question:   Supervising Provider    Answer:   Quentin AngstJEGEDE, OLUGBEMIGA E [id number]    Follow-up: Return in about 2 months (around 04/08/2020).    Barbette Merinorystal M Roshni Burbano, NP

## 2020-02-08 LAB — COMP. METABOLIC PANEL (12)
AST: 91 IU/L — ABNORMAL HIGH (ref 0–40)
Albumin/Globulin Ratio: 2.1 (ref 1.2–2.2)
Albumin: 4.8 g/dL (ref 4.0–5.0)
Alkaline Phosphatase: 88 IU/L (ref 44–121)
BUN/Creatinine Ratio: 7 — ABNORMAL LOW (ref 9–20)
BUN: 8 mg/dL (ref 6–24)
Bilirubin Total: 1 mg/dL (ref 0.0–1.2)
Calcium: 10 mg/dL (ref 8.7–10.2)
Chloride: 102 mmol/L (ref 96–106)
Creatinine, Ser: 1.09 mg/dL (ref 0.76–1.27)
GFR calc Af Amer: 92 mL/min/{1.73_m2} (ref >59)
GFR calc non Af Amer: 80 mL/min/{1.73_m2} (ref >59)
Globulin, Total: 2.3 g/dL (ref 1.5–4.5)
Glucose: 93 mg/dL (ref 65–99)
Potassium: 4.8 mmol/L (ref 3.5–5.2)
Sodium: 141 mmol/L (ref 134–144)
Total Protein: 7.1 g/dL (ref 6.0–8.5)

## 2020-02-08 LAB — LIPID PANEL
Chol/HDL Ratio: 1.8 ratio (ref 0.0–5.0)
Cholesterol, Total: 179 mg/dL (ref 100–199)
HDL: 101 mg/dL (ref >39)
LDL Chol Calc (NIH): 65 mg/dL (ref 0–99)
Triglycerides: 72 mg/dL (ref 0–149)
VLDL Cholesterol Cal: 13 mg/dL (ref 5–40)

## 2020-02-08 LAB — PSA: Prostate Specific Ag, Serum: 3.2 ng/mL (ref 0.0–4.0)

## 2020-03-11 ENCOUNTER — Telehealth: Payer: Self-pay | Admitting: Nurse Practitioner

## 2020-03-11 NOTE — Telephone Encounter (Signed)
Pt was called concerning AWV w/ PCC. Unable to lvm

## 2020-04-02 ENCOUNTER — Ambulatory Visit (INDEPENDENT_AMBULATORY_CARE_PROVIDER_SITE_OTHER): Payer: Commercial Managed Care - PPO | Admitting: Podiatry

## 2020-04-02 ENCOUNTER — Ambulatory Visit (INDEPENDENT_AMBULATORY_CARE_PROVIDER_SITE_OTHER): Payer: Commercial Managed Care - PPO

## 2020-04-02 ENCOUNTER — Other Ambulatory Visit: Payer: Self-pay

## 2020-04-02 DIAGNOSIS — M898X7 Other specified disorders of bone, ankle and foot: Secondary | ICD-10-CM

## 2020-04-02 DIAGNOSIS — R600 Localized edema: Secondary | ICD-10-CM

## 2020-04-05 NOTE — Progress Notes (Signed)
   Subjective:  Patient presents today status post right forefoot surgery and left 5th metatarsal head resection. DOS: 03/16/18.  Patient states that he continues to have pain and tenderness secondary to the bone at the level of the fifth metatarsal of the right foot.  This is his main complaint today.   Past Medical History:  Diagnosis Date  . Frequency of urination and polyuria 02/27/2015  . Hypertension   . Libido, decreased 04/22/2016  . Palpitations 02/15/2017  . Testosterone deficiency in male   . Tobacco dependence 04/03/2015      Objective/Physical Exam Neurovascular status intact.  Skin incisions appear to be well coapted. No sign of infectious process noted. No dehiscence. No active bleeding noted. Chronic edema noted to the right foot. Pain on palpation noted to the right fifth metatarsal.   Assessment: 1. s/p right forefoot surgery and left 5th metatarsal head resection. DOS: 03/16/18 2. Chronic pain right foot 3. Bony exostosis right fifth metatarsal    Plan of Care:  1. Patient was evaluated.  2. Today we discussed the conservative versus surgical management of the presenting pathology. The patient opts for surgical management. All possible complications and details of the procedure were explained. All patient questions were answered. No guarantees were expressed or implied. 3. Authorization for surgery was initiated today. Surgery will consist of 5th metatarsal exostectomy right.  4. Return to clinic one week post op.    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     Felecia Shelling, DPM [facility]  Dr. Felecia Shelling, DPM    [address]                                        [place], [place] [address]                Office [phone]  Fax [phone]

## 2020-04-09 ENCOUNTER — Ambulatory Visit: Payer: Commercial Managed Care - PPO | Admitting: Nurse Practitioner

## 2020-07-15 ENCOUNTER — Telehealth: Payer: Self-pay | Admitting: Urology

## 2020-07-15 NOTE — Telephone Encounter (Signed)
DOS: 07/24/20  METATARSAL HEAD RES. 5TH RIGHT -- [phone]  SPOKE WITH JAY WITH [facility] AND HE STATED THAT NO PRIOR AUTH IS NEEDED WITH CPT CODE 22449. REF # [id number]

## 2020-07-29 ENCOUNTER — Other Ambulatory Visit: Payer: Self-pay

## 2020-07-29 ENCOUNTER — Ambulatory Visit (HOSPITAL_COMMUNITY): Payer: Commercial Managed Care - PPO | Admitting: Licensed Clinical Social Worker

## 2020-07-29 DIAGNOSIS — F1010 Alcohol abuse, uncomplicated: Secondary | ICD-10-CM

## 2020-07-29 NOTE — Progress Notes (Incomplete)
Wife complaining about dirnking for 1-2 months; stressing about bills 2021 foot on surgery infection in foot; pending bone spur Welbutrin; 6 months Fun: bbq, only have 2 ppl hang out with brother in law and best friend Beer: relaxes me and like for the taste; sometimes I dont like to be bothered; Chiropractor last month Moved away from [place]; "I know too many people I  Was running around with" "I was hanging out too much" Talk to parents twice weekly; last seen 2019; when I go up there I stay a week

## 2020-07-30 ENCOUNTER — Encounter: Payer: Commercial Managed Care - PPO | Admitting: Podiatry

## 2020-08-04 ENCOUNTER — Other Ambulatory Visit: Payer: Self-pay

## 2020-08-04 ENCOUNTER — Ambulatory Visit (HOSPITAL_COMMUNITY): Payer: Commercial Managed Care - PPO | Admitting: Licensed Clinical Social Worker

## 2020-08-06 ENCOUNTER — Encounter: Payer: Commercial Managed Care - PPO | Admitting: Podiatry

## 2020-08-18 ENCOUNTER — Ambulatory Visit (HOSPITAL_COMMUNITY): Payer: Commercial Managed Care - PPO | Admitting: Licensed Clinical Social Worker

## 2020-08-18 ENCOUNTER — Other Ambulatory Visit: Payer: Self-pay

## 2020-08-20 ENCOUNTER — Encounter: Payer: Commercial Managed Care - PPO | Admitting: Podiatry

## 2020-08-28 ENCOUNTER — Other Ambulatory Visit: Payer: Self-pay | Admitting: Podiatry

## 2020-08-28 MED ORDER — MELOXICAM 15 MG PO TABS: 15.0000 mg | ORAL_TABLET | Freq: Every day | ORAL | 1 refills | Status: DC

## 2020-08-28 MED ORDER — OXYCODONE-ACETAMINOPHEN 5-325 MG PO TABS: 1.0000 | ORAL_TABLET | ORAL | 0 refills | Status: DC | PRN

## 2020-08-28 NOTE — Progress Notes (Signed)
PRN postop 

## 2020-08-29 ENCOUNTER — Telehealth: Payer: Self-pay

## 2020-08-29 NOTE — Telephone Encounter (Signed)
Joshua Blake did not show for his surgery with Dr. Logan Bores on 08/28/2020. I left him a message about getting the surgery rescheduled.

## 2020-09-03 ENCOUNTER — Encounter: Payer: Commercial Managed Care - PPO | Admitting: Podiatry

## 2020-09-10 ENCOUNTER — Encounter: Payer: Commercial Managed Care - PPO | Admitting: Podiatry

## 2020-09-24 ENCOUNTER — Encounter: Payer: Commercial Managed Care - PPO | Admitting: Podiatry

## 2021-02-23 ENCOUNTER — Emergency Department (HOSPITAL_COMMUNITY)
Admission: EM | Admit: 2021-02-23 | Discharge: 2021-02-24 | Disposition: A | Discharge status: Home / Self Care | Payer: Commercial Managed Care - PPO | Attending: Emergency Medicine | Admitting: Emergency Medicine

## 2021-02-23 DIAGNOSIS — Z20822 Contact with and (suspected) exposure to covid-19: Secondary | ICD-10-CM | POA: Diagnosis not present

## 2021-02-23 DIAGNOSIS — M79602 Pain in left arm: Secondary | ICD-10-CM | POA: Insufficient documentation

## 2021-02-23 DIAGNOSIS — I10 Essential (primary) hypertension: Secondary | ICD-10-CM | POA: Insufficient documentation

## 2021-02-23 DIAGNOSIS — R519 Headache, unspecified: Secondary | ICD-10-CM | POA: Diagnosis not present

## 2021-02-23 DIAGNOSIS — R209 Unspecified disturbances of skin sensation: Secondary | ICD-10-CM | POA: Diagnosis not present

## 2021-02-23 DIAGNOSIS — Z5321 Procedure and treatment not carried out due to patient leaving prior to being seen by health care provider: Secondary | ICD-10-CM | POA: Insufficient documentation

## 2021-02-23 NOTE — ED Triage Notes (Signed)
Pt c/o L sided numbness/tingling x45mo, endorses intermittent HA for same. Hx HTN, denies injury. Grip intact, ambulatory, sensation is only deficit.

## 2021-02-23 NOTE — ED Provider Notes (Addendum)
Emergency Medicine Provider Triage Evaluation Note  Joshua Blake. , a 50 y.o. male  was evaluated in triage.  Pt complains of numbness/tingling left side of his body for about a month now.  States occasionally he feels weak but is able to walk normally, not dragging his left, able to hold objects in left hand.  States headaches as well-- several times a week which is also new.  No speech disturbance, vision loss/change.  He is still able to work.  Hx of HTN and on meds.  No hx of TIA or stroke.  Review of Systems  Positive: Tingling, headaches Negative: Weakness, double vision  Physical Exam  BP 111/78   Pulse (!) 56   Temp 98.6 F (37 C) (Oral)   Resp 18   SpO2 97%  Gen:   Awake, no distress   Resp:  Normal effort  MSK:   Moves extremities without difficulty, good strength bilaterally, speech clear, no facial droop, ambulatory Other:    Medical Decision Making  Medically screening exam initiated at 11:56 PM.  Appropriate orders placed.  Joshua Blake. was informed that the remainder of the evaluation will be completed by another provider, this initial triage assessment does not replace that evaluation, and the importance of remaining in the ED until their evaluation is complete.  Numbness/tinlging left side of body x1 months.  Also with new headaches.  Very far out of the window for code stroke, no signs of LVO on exam. Will proceed with labs and head CT.   Joshua Hatchet, PA-C 02/23/21 2358    Joshua Hatchet, PA-C 02/24/21 0003    Melene Plan, DO 02/24/21 [phone]

## 2021-02-24 ENCOUNTER — Ambulatory Visit (INDEPENDENT_AMBULATORY_CARE_PROVIDER_SITE_OTHER): Payer: Commercial Managed Care - PPO | Admitting: Nurse Practitioner

## 2021-02-24 ENCOUNTER — Other Ambulatory Visit: Payer: Self-pay

## 2021-02-24 ENCOUNTER — Emergency Department (HOSPITAL_COMMUNITY): Payer: Commercial Managed Care - PPO

## 2021-02-24 ENCOUNTER — Encounter: Payer: Self-pay | Admitting: Nurse Practitioner

## 2021-02-24 ENCOUNTER — Ambulatory Visit (HOSPITAL_COMMUNITY)
Admission: RE | Admit: 2021-02-24 | Discharge: 2021-02-24 | Disposition: A | Discharge status: Home / Self Care | Payer: Commercial Managed Care - PPO | Source: Ambulatory Visit | Attending: Nurse Practitioner | Admitting: Nurse Practitioner

## 2021-02-24 VITALS — BP 119/85 | HR 72 | Temp 97.3°F | Ht 72.0 in | Wt 197.0 lb

## 2021-02-24 DIAGNOSIS — R200 Anesthesia of skin: Secondary | ICD-10-CM

## 2021-02-24 DIAGNOSIS — M79602 Pain in left arm: Secondary | ICD-10-CM

## 2021-02-24 DIAGNOSIS — I10 Essential (primary) hypertension: Secondary | ICD-10-CM

## 2021-02-24 DIAGNOSIS — Z720 Tobacco use: Secondary | ICD-10-CM

## 2021-02-24 LAB — URINALYSIS, ROUTINE W REFLEX MICROSCOPIC
Bacteria, UA: NONE SEEN
Bilirubin Urine: NEGATIVE
Glucose, UA: NEGATIVE mg/dL
Hgb urine dipstick: NEGATIVE
Ketones, ur: 5 mg/dL — AB
Leukocytes,Ua: NEGATIVE
Nitrite: NEGATIVE
Protein, ur: NEGATIVE mg/dL
Specific Gravity, Urine: 1.018 (ref 1.005–1.030)
pH: 5 (ref 5.0–8.0)

## 2021-02-24 LAB — DIFFERENTIAL
Abs Immature Granulocytes: 0.05 10*3/uL (ref 0.00–0.07)
Basophils Absolute: 0.1 10*3/uL (ref 0.0–0.1)
Basophils Relative: 1 %
Eosinophils Absolute: 0.1 10*3/uL (ref 0.0–0.5)
Eosinophils Relative: 2 %
Immature Granulocytes: 1 %
Lymphocytes Relative: 25 %
Lymphs Abs: 2.2 10*3/uL (ref 0.7–4.0)
Monocytes Absolute: 0.6 10*3/uL (ref 0.1–1.0)
Monocytes Relative: 7 %
Neutro Abs: 5.8 10*3/uL (ref 1.7–7.7)
Neutrophils Relative %: 64 %

## 2021-02-24 LAB — CBC
HCT: 43.6 % (ref 39.0–52.0)
Hemoglobin: 14.5 g/dL (ref 13.0–17.0)
MCH: 32.8 pg (ref 26.0–34.0)
MCHC: 33.3 g/dL (ref 30.0–36.0)
MCV: 98.6 fL (ref 80.0–100.0)
Platelets: 222 10*3/uL (ref 150–400)
RBC: 4.42 MIL/uL (ref 4.22–5.81)
RDW: 11.9 % (ref 11.5–15.5)
WBC: 8.8 10*3/uL (ref 4.0–10.5)
nRBC: 0 % (ref 0.0–0.2)

## 2021-02-24 LAB — I-STAT CHEM 8, ED
BUN: 18 mg/dL (ref 6–20)
Calcium, Ion: 1.16 mmol/L (ref 1.15–1.40)
Chloride: 99 mmol/L (ref 98–111)
Creatinine, Ser: 1.4 mg/dL — ABNORMAL HIGH (ref 0.61–1.24)
Glucose, Bld: 96 mg/dL (ref 70–99)
HCT: 47 % (ref 39.0–52.0)
Hemoglobin: 16 g/dL (ref 13.0–17.0)
Potassium: 3.8 mmol/L (ref 3.5–5.1)
Sodium: 135 mmol/L (ref 135–145)
TCO2: 28 mmol/L (ref 22–32)

## 2021-02-24 LAB — COMPREHENSIVE METABOLIC PANEL
ALT: 42 U/L (ref 0–44)
AST: 33 U/L (ref 15–41)
Albumin: 4.2 g/dL (ref 3.5–5.0)
Alkaline Phosphatase: 66 U/L (ref 38–126)
Anion gap: 12 (ref 5–15)
BUN: 17 mg/dL (ref 6–20)
CO2: 24 mmol/L (ref 22–32)
Calcium: 9.6 mg/dL (ref 8.9–10.3)
Chloride: 98 mmol/L (ref 98–111)
Creatinine, Ser: 1.41 mg/dL — ABNORMAL HIGH (ref 0.61–1.24)
GFR, Estimated: >60 mL/min (ref >60)
Glucose, Bld: 97 mg/dL (ref 70–99)
Potassium: 3.8 mmol/L (ref 3.5–5.1)
Sodium: 134 mmol/L — ABNORMAL LOW (ref 135–145)
Total Bilirubin: 1.1 mg/dL (ref 0.3–1.2)
Total Protein: 7 g/dL (ref 6.5–8.1)

## 2021-02-24 LAB — RAPID URINE DRUG SCREEN, HOSP PERFORMED
Amphetamines: NOT DETECTED
Barbiturates: NOT DETECTED
Benzodiazepines: NOT DETECTED
Cocaine: NOT DETECTED
Opiates: NOT DETECTED
Tetrahydrocannabinol: NOT DETECTED

## 2021-02-24 LAB — RESP PANEL BY RT-PCR (FLU A&B, COVID) ARPGX2
Influenza A by PCR: NEGATIVE
Influenza B by PCR: NEGATIVE
SARS Coronavirus 2 by RT PCR: NEGATIVE

## 2021-02-24 LAB — ETHANOL: Alcohol, Ethyl (B): <10 mg/dL (ref <10)

## 2021-02-24 LAB — PROTIME-INR
INR: 1.1 (ref 0.8–1.2)
Prothrombin Time: 13.7 seconds (ref 11.4–15.2)

## 2021-02-24 LAB — APTT: aPTT: 27 seconds (ref 24–36)

## 2021-02-24 MED ORDER — CYCLOBENZAPRINE HCL 10 MG PO TABS
10.0000 mg | ORAL_TABLET | Freq: Three times a day (TID) | ORAL | 0 refills | Status: DC | PRN
  Filled 2021-02-24: qty 30, 10d supply, fill #0

## 2021-02-24 MED ORDER — LISINOPRIL-HYDROCHLOROTHIAZIDE 10-12.5 MG PO TABS
1.0000 | ORAL_TABLET | Freq: Every day | ORAL | 3 refills | Status: DC
  Filled 2021-02-24: qty 30, 30d supply, fill #0

## 2021-02-24 MED ORDER — METHYLPREDNISOLONE SODIUM SUCC 40 MG IJ SOLR
40.0000 mg | Freq: Once | INTRAMUSCULAR | Status: AC
  Administered 2021-02-24: 40 mg via INTRAMUSCULAR

## 2021-02-24 MED ORDER — AMLODIPINE BESYLATE 5 MG PO TABS
5.0000 mg | ORAL_TABLET | Freq: Every day | ORAL | 3 refills | Status: AC
  Filled 2021-02-24: qty 30, 30d supply, fill #0

## 2021-02-24 MED ORDER — BUPROPION HCL ER (SR) 150 MG PO TB12
150.0000 mg | ORAL_TABLET | Freq: Two times a day (BID) | ORAL | 3 refills | Status: DC
  Filled 2021-02-24: qty 60, 30d supply, fill #0

## 2021-02-24 NOTE — ED Notes (Signed)
Pt called multiple times no answer 

## 2021-02-24 NOTE — Patient Instructions (Signed)
You were seen today in the [facility] for pinching and numbness in left arm. Labs were collected, results will be available via MyChart or, if abnormal, you will be contacted by clinic staff. You were prescribed medications, please take as directed. Please follow up in 1 mth for reevaluation.

## 2021-02-24 NOTE — Progress Notes (Signed)
[facility] Patient [facility] [address] [place], [place]  [address] Phone:  [phone]   Fax:  [phone] Subjective:   Patient ID: Joshua Blake., male    DOB: 1970/07/20, 50 y.o.   MRN: [id number]  Chief Complaint  Patient presents with   Numbness    Left side from shoulder down to leg, was at ED yesterday left before getting any treatment. Occasional headaches    HPI Falon Flinchum. 50 y.o. male  has a past medical history of Frequency of urination and polyuria (02/27/2015), Hypertension, Libido, decreased (04/22/2016), Palpitations (02/15/2017), Testosterone deficiency in male, and Tobacco dependence (04/03/2015).  To the [facility] for numbness and pinching in the LUE. Patient states that he has had worsening symptoms x 1 mth. Pain increases with movement of LUE. Experiences increased numbness in LUE when sleeping or laying in the supine position. Symptoms only improve when sitting and not moving effected extremity. Took BC powder x 1 for symptoms with no improvement. Denies any recent trauma or injury. Was evaluated in the ED for symptoms yesterday with no improvement or clear treatment plan. Currently works in a Building services engineer. Also endorses intermittent headaches.   Headaches located in the left frontal portion of head. Describes pain as throbbing and aching, occurring intermittently. Denies any worsening or improving factors. Denies taking any medications for HA. States that HA's occur when LUE pain occurs. Pain radiates from neck to shoulder to left arm.  Denies any photosensitivity or nausea/vomiting. Denies any fever. Denies any fatigue, chest pain, shortness of breath, HA or dizziness. Denies any blurred vision, numbness or tingling.   Past Medical History:  Diagnosis Date   Frequency of urination and polyuria 02/27/2015   Hypertension    Libido, decreased 04/22/2016   Palpitations 02/15/2017   Testosterone deficiency in male    Tobacco dependence  04/03/2015    Past Surgical History:  Procedure Laterality Date   FOOT SURGERY      Family History  Problem Relation Age of Onset   Hypertension Mother    Hypertension Father    Diabetes Maternal Grandmother     Social History   Socioeconomic History   Marital status: Married    Spouse name: Not on file   Number of children: Not on file   Years of education: Not on file   Highest education level: Not on file  Occupational History   Not on file  Tobacco Use   Smoking status: Every Day    Packs/day: 1.50    Types: Cigarettes   Smokeless tobacco: Current  Vaping Use   Vaping Use: Never used  Substance and Sexual Activity   Alcohol use: Yes    Alcohol/week: 42.0 standard drinks    Types: 42 Cans of beer per week    Comment: daily   Drug use: No   Sexual activity: Yes  Other Topics Concern   Not on file  Social History Narrative   Not on file   Social Determinants of Health   Financial Resource Strain: Not on file  Food Insecurity: Not on file  Transportation Needs: Not on file  Physical Activity: Not on file  Stress: Not on file  Social Connections: Not on file  Intimate Partner Violence: Not on file    Outpatient Medications Prior to Visit  Medication Sig Dispense Refill   acetaminophen (TYLENOL) 500 MG tablet Take 1 tablet (500 mg total) by mouth 2 (two) times daily. 60 tablet 3   amLODipine (  NORVASC) 5 MG tablet Take 1 tablet (5 mg total) by mouth daily. 90 tablet 3   buPROPion (WELLBUTRIN SR) 150 MG 12 hr tablet Take 1 tablet (150 mg total) by mouth 2 (two) times daily. 180 tablet 3   lisinopril-hydrochlorothiazide (ZESTORETIC) 10-12.5 MG tablet Take 1 tablet by mouth daily. 90 tablet 3   meloxicam (MOBIC) 15 MG tablet Take 1 tablet (15 mg total) by mouth daily. 30 tablet 1   nicotine (NICODERM CQ) 21 mg/24hr patch Place 1 patch (21 mg total) onto the skin daily. 28 patch 0   oxyCODONE-acetaminophen (PERCOCET) 5-325 MG tablet Take 1 tablet by mouth every  4 (four) hours as needed for severe pain. 30 tablet 0   Facility-Administered Medications Prior to Visit  Medication Dose Route Frequency Provider Last Rate Last Admin   cloNIDine (CATAPRES) tablet 0.3 mg  0.3 mg Oral Once Kallie Locks, FNP        No Known Allergies  Review of Systems  Constitutional:  Negative for chills, fever and malaise/fatigue.  HENT: Negative.    Eyes: Negative.   Respiratory:  Negative for cough and shortness of breath.   Cardiovascular:  Negative for chest pain, palpitations and leg swelling.  Gastrointestinal:  Negative for abdominal pain, blood in stool, constipation, diarrhea, nausea and vomiting.  Musculoskeletal:        See HPI  Skin: Negative.   Neurological:  Positive for tingling, sensory change and headaches. Negative for dizziness, tremors, speech change, focal weakness, seizures, loss of consciousness and weakness.  Psychiatric/Behavioral:  Negative for depression. The patient is not nervous/anxious.   All other systems reviewed and are negative.     Objective:    Physical Exam Vitals reviewed.  Constitutional:      General: He is not in acute distress.    Appearance: Normal appearance. He is normal weight.  HENT:     Head: Normocephalic.     Right Ear: Tympanic membrane, ear canal and external ear normal.     Left Ear: Tympanic membrane, ear canal and external ear normal.     Nose: Nose normal.     Mouth/Throat:     Mouth: Mucous membranes are moist.     Pharynx: Oropharynx is clear.  Cardiovascular:     Rate and Rhythm: Normal rate and regular rhythm.     Pulses: Normal pulses.     Heart sounds: Normal heart sounds.     Comments: No obvious peripheral edema Pulmonary:     Effort: Pulmonary effort is normal.     Breath sounds: Normal breath sounds.  Abdominal:     General: Abdomen is flat. Bowel sounds are normal.     Palpations: Abdomen is soft.  Musculoskeletal:        General: No swelling, tenderness, deformity or signs  of injury.     Cervical back: Normal range of motion. No rigidity or tenderness.     Comments: LROM of left shoulder due to pain  Skin:    General: Skin is warm and dry.     Capillary Refill: Capillary refill takes less than 2 seconds.  Neurological:     General: No focal deficit present.     Mental Status: He is alert and oriented to person, place, and time.     Cranial Nerves: No cranial nerve deficit.     Motor: No weakness.     Gait: Gait normal.  Psychiatric:        Mood and Affect: Mood normal.  Behavior: Behavior normal.        Thought Content: Thought content normal.        Judgment: Judgment normal.    BP 119/85 (BP Location: Right Arm, Patient Position: Sitting)   Pulse 72   Temp (!) 97.3 F (36.3 C)   Ht 6' (1.829 m)   Wt 197 lb (89.4 kg)   SpO2 99%   BMI 26.72 kg/m  Wt Readings from Last 3 Encounters:  02/24/21 197 lb (89.4 kg)  02/07/20 199 lb (90.3 kg)  11/07/19 204 lb (92.5 kg)    Immunization History  Administered Date(s) Administered   Tdap 08/02/2018   Unspecified SARS-COV-2 Vaccination 10/10/2019, 11/08/2019    Diabetic Foot Exam - Simple   No data filed     Lab Results  Component Value Date   TSH 1.250 07/31/2019   Lab Results  Component Value Date   WBC 8.8 02/24/2021   HGB 16.0 02/24/2021   HCT 47.0 02/24/2021   MCV 98.6 02/24/2021   PLT 222 02/24/2021   Lab Results  Component Value Date   NA 135 02/24/2021   K 3.8 02/24/2021   CO2 24 02/24/2021   GLUCOSE 96 02/24/2021   BUN 18 02/24/2021   CREATININE 1.40 (H) 02/24/2021   BILITOT 1.1 02/24/2021   ALKPHOS 66 02/24/2021   AST 33 02/24/2021   ALT 42 02/24/2021   PROT 7.0 02/24/2021   ALBUMIN 4.2 02/24/2021   CALCIUM 9.6 02/24/2021   ANIONGAP 12 02/24/2021   Lab Results  Component Value Date   CHOL 179 02/07/2020   CHOL 158 07/31/2019   CHOL 155 08/02/2018   Lab Results  Component Value Date   HDL 101 02/07/2020   HDL 87 07/31/2019   HDL 69 08/02/2018   Lab  Results  Component Value Date   LDLCALC 65 02/07/2020   LDLCALC 57 07/31/2019   LDLCALC 62 08/02/2018   Lab Results  Component Value Date   TRIG 72 02/07/2020   TRIG 74 07/31/2019   TRIG 121 08/02/2018   Lab Results  Component Value Date   CHOLHDL 1.8 02/07/2020   CHOLHDL 1.8 07/31/2019   CHOLHDL 2.2 08/02/2018   Lab Results  Component Value Date   HGBA1C 4.8 03/07/2019   HGBA1C 4.8 08/02/2018   HGBA1C 4.8 04/22/2016       Assessment & Plan:  Per chart review, patient was was evaluated for symptoms yesterday in the ED, but left prior to completion of evaluation and treatment. Extensive workup completed in triage, including CT, with benign results.  Based on history and physical concerned for cervical DDD v cervical radiculopathy v arthralgia v musculoskeletal strain v other musculoskeletal abnormality. No suspicion for intracranial abnormality given benign results completed yesterday.  No suspicion for fracture at this time, patient denies any trauma or injury.   Problem List Items Addressed This Visit       Cardiovascular and Mediastinum   Essential hypertension   Relevant Medications   amLODipine (NORVASC) 5 MG tablet   lisinopril-hydrochlorothiazide (ZESTORETIC) 10-12.5 MG tablet   Other Relevant Orders   Lipid panel   Other Visit Diagnoses     Tobacco abuse    -  Primary   Relevant Medications   buPROPion (WELLBUTRIN SR) 150 MG 12 hr tablet   Left arm pain       Relevant Medications   methylPREDNISolone sodium succinate (SOLU-MEDROL) 40 mg/mL injection 40 mg (Completed)   cyclobenzaprine (FLEXERIL) 10 MG tablet   Other Relevant Orders  DG Shoulder Left   DG Cervical Spine Complete Discussed non pharmacological methods for managing symptoms Referral completed to orthopedics for further evaluation and management   AMB referral to orthopedics   Left arm numbness       Relevant Medications   methylPREDNISolone sodium succinate (SOLU-MEDROL) 40 mg/mL  injection 40 mg (Completed)   cyclobenzaprine (FLEXERIL) 10 MG tablet   Other Relevant Orders   DG Shoulder Left   DG Cervical Spine Complete   AMB referral to orthopedics   Follow up in 1 mth for reevaluation of symptoms, sooner as needed    I have discontinued Erie Noe Jr.'s nicotine, oxyCODONE-acetaminophen, and meloxicam. I am also having him start on cyclobenzaprine. Additionally, I am having him maintain his acetaminophen, amLODipine, buPROPion, and lisinopril-hydrochlorothiazide. We administered methylPREDNISolone sodium succinate. We will continue to administer cloNIDine.  Meds ordered this encounter  Medications   amLODipine (NORVASC) 5 MG tablet    Sig: Take 1 tablet (5 mg total) by mouth daily.    Dispense:  90 tablet    Refill:  3   buPROPion (WELLBUTRIN SR) 150 MG 12 hr tablet    Sig: Take 1 tablet (150 mg total) by mouth 2 (two) times daily.    Dispense:  180 tablet    Refill:  3   lisinopril-hydrochlorothiazide (ZESTORETIC) 10-12.5 MG tablet    Sig: Take 1 tablet by mouth daily.    Dispense:  90 tablet    Refill:  3   methylPREDNISolone sodium succinate (SOLU-MEDROL) 40 mg/mL injection 40 mg   cyclobenzaprine (FLEXERIL) 10 MG tablet    Sig: Take 1 tablet (10 mg total) by mouth 3 (three) times daily as needed for muscle spasms.    Dispense:  30 tablet    Refill:  0     Kathrynn Speed, NP

## 2021-02-25 LAB — LIPID PANEL
Chol/HDL Ratio: 2.3 ratio (ref 0.0–5.0)
Cholesterol, Total: 178 mg/dL (ref 100–199)
HDL: 76 mg/dL (ref >39)
LDL Chol Calc (NIH): 78 mg/dL (ref 0–99)
Triglycerides: 141 mg/dL (ref 0–149)
VLDL Cholesterol Cal: 24 mg/dL (ref 5–40)

## 2021-02-27 ENCOUNTER — Other Ambulatory Visit: Payer: Self-pay

## 2021-02-27 ENCOUNTER — Ambulatory Visit (INDEPENDENT_AMBULATORY_CARE_PROVIDER_SITE_OTHER): Payer: Commercial Managed Care - PPO | Admitting: Orthopaedic Surgery

## 2021-02-27 ENCOUNTER — Encounter: Payer: Self-pay | Admitting: Orthopaedic Surgery

## 2021-02-27 DIAGNOSIS — M5412 Radiculopathy, cervical region: Secondary | ICD-10-CM

## 2021-02-27 MED ORDER — DICLOFENAC SODIUM 75 MG PO TBEC
75.0000 mg | DELAYED_RELEASE_TABLET | Freq: Two times a day (BID) | ORAL | 2 refills | Status: DC | PRN
  Filled 2021-02-27: qty 60, 30d supply, fill #0

## 2021-02-27 MED ORDER — PREDNISONE 10 MG PO TABS
ORAL_TABLET | ORAL | 0 refills | Status: DC
  Filled 2021-02-27: qty 21, 6d supply, fill #0

## 2021-02-27 NOTE — Progress Notes (Signed)
Office Visit Note   Patient: Joshua Blake.           Date of Birth: [birth year]           MRN: [id number] Visit Date: 02/27/2021              Requested by: Orion Crook I, NP [address] [place],  [place] [address] PCP: Barbette Merino, NP   Assessment & Plan: Visit Diagnoses:  1. Radiculopathy of cervical spine     Plan: Impression is cervical spine radiculopathy left upper extremity.  At this point, would like to start the patient on a steroid taper followed by a course of NSAIDs as needed.  He will go ahead and pick up his Flexeril as well.  We have discussed outpatient physical therapy for which she is agreeable to.  Internal referral has been made.  He will follow-up with [place] if his symptoms have not improved over the next few months.  Call with concerns or questions in the meantime.  Follow-Up Instructions: Return if symptoms worsen or fail to improve.   Orders:  Orders Placed This Encounter  Procedures   Ambulatory referral to Physical Therapy    Meds ordered this encounter  Medications   predniSONE (STERAPRED UNI-PAK 21 TAB) 10 MG (21) TBPK tablet    Sig: Take as directed. Do not take in conjunction with any antiinflammatories    Dispense:  21 tablet    Refill:  0   diclofenac (VOLTAREN) 75 MG EC tablet    Sig: Take 1 tablet (75 mg total) by mouth 2 (two) times daily as needed. May take as needed once finished with steroid taper    Dispense:  60 tablet    Refill:  2       Procedures: No procedures performed   Clinical Data: No additional findings.   Subjective: Chief Complaint  Patient presents with   Left Shoulder - Numbness, Pain, Tingling   Neck - Pain    HPI patient is a pleasant 50 year old gentleman who comes in today with left lateral neck pain and paresthesias down the left upper extremity.  This is been ongoing for the past month.  No known injury or change in activity.  Pain is worse with sleeping, rotating his neck to the right  as well as lifting his shoulder.  He has been taking BC powder without relief.  He was recently prescribed Flexeril but is not started this.  He denies any weakness to the left upper extremity.  No previous history of neck or shoulder pathology.  Review of Systems as detailed in HPI.  All others reviewed and are negative.   Objective: Vital Signs: There were no vitals taken for this visit.  Physical Exam well-developed well-nourished gentleman in no acute distress.  Alert and oriented x3.  Ortho Exam cervical spine exam shows no spinous tenderness.  He has left-sided paraspinous tenderness.  He has increased pain with cervical spine extension and rotation to the right.  No focal weakness.  He is neurovascular intact distally.  Specialty Comments:  No specialty comments available.  Imaging: X-rays of the left shoulder reviewed by me in canopy are unremarkable. X-rays of the cervical spine reviewed by me in canopy show straightening of the cervical spine.  Mild multilevel degenerative changes with anterior osteophyte formation.   PMFS History: Patient Active Problem List   Diagnosis Date Noted   Palpitations 02/15/2017   Libido, decreased 04/22/2016   Tobacco dependence 04/03/2015  Essential hypertension 02/27/2015   Frequency of urination and polyuria 02/27/2015   Past Medical History:  Diagnosis Date   Frequency of urination and polyuria 02/27/2015   Hypertension    Libido, decreased 04/22/2016   Palpitations 02/15/2017   Testosterone deficiency in male    Tobacco dependence 04/03/2015    Family History  Problem Relation Age of Onset   Hypertension Mother    Hypertension Father    Diabetes Maternal Grandmother     Past Surgical History:  Procedure Laterality Date   FOOT SURGERY     Social History   Occupational History   Not on file  Tobacco Use   Smoking status: Every Day    Packs/day: 1.50    Types: Cigarettes   Smokeless tobacco: Current  Vaping Use    Vaping Use: Never used  Substance and Sexual Activity   Alcohol use: Yes    Alcohol/week: 42.0 standard drinks    Types: 42 Cans of beer per week    Comment: daily   Drug use: No   Sexual activity: Yes

## 2021-03-09 ENCOUNTER — Ambulatory Visit: Payer: Commercial Managed Care - PPO | Admitting: Rehabilitative and Restorative Service Providers"

## 2021-03-23 ENCOUNTER — Emergency Department (HOSPITAL_COMMUNITY)
Admission: EM | Admit: 2021-03-23 | Discharge: 2021-03-23 | Disposition: A | Discharge status: Home / Self Care | Payer: Commercial Managed Care - PPO | Attending: Emergency Medicine | Admitting: Emergency Medicine

## 2021-03-23 ENCOUNTER — Encounter (HOSPITAL_COMMUNITY): Payer: Self-pay

## 2021-03-23 ENCOUNTER — Other Ambulatory Visit: Payer: Self-pay

## 2021-03-23 ENCOUNTER — Other Ambulatory Visit (HOSPITAL_COMMUNITY): Payer: Self-pay

## 2021-03-23 DIAGNOSIS — Z5321 Procedure and treatment not carried out due to patient leaving prior to being seen by health care provider: Secondary | ICD-10-CM

## 2021-03-23 DIAGNOSIS — F10239 Alcohol dependence with withdrawal, unspecified: Secondary | ICD-10-CM | POA: Insufficient documentation

## 2021-03-23 NOTE — ED Provider Notes (Signed)
Patient left without being seen  1. Patient left without being seen       Silva Bandy, PA-C 03/23/21 1250    Rozelle Logan, DO 03/23/21 1544

## 2021-03-23 NOTE — ED Triage Notes (Signed)
Patient is requesting detox from alcohol and states his last drink was this AM. Patient states he had a 12 ounce beer this AM.

## 2021-03-24 ENCOUNTER — Ambulatory Visit: Payer: Self-pay | Admitting: Nurse Practitioner

## 2021-03-25 ENCOUNTER — Other Ambulatory Visit (HOSPITAL_COMMUNITY): Payer: Self-pay

## 2021-03-25 MED ORDER — AMLODIPINE BESYLATE 10 MG PO TABS
5.0000 mg | ORAL_TABLET | Freq: Every day | ORAL | 0 refills | Status: DC
Start: 2021-03-25 — End: 2021-10-02
  Filled 2021-03-25: qty 15, 30d supply, fill #0

## 2021-03-25 MED ORDER — HYDROCHLOROTHIAZIDE 25 MG PO TABS
25.0000 mg | ORAL_TABLET | Freq: Every day | ORAL | 0 refills | Status: DC
  Filled 2021-03-25: qty 30, 30d supply, fill #0

## 2021-03-25 MED ORDER — LISINOPRIL 5 MG PO TABS
5.0000 mg | ORAL_TABLET | Freq: Every day | ORAL | 3 refills | Status: DC
  Filled 2021-03-25: qty 90, 90d supply, fill #0

## 2021-03-25 MED ORDER — AMLODIPINE BESYLATE 5 MG PO TABS
5.0000 mg | ORAL_TABLET | Freq: Every day | ORAL | 0 refills | Status: DC
  Filled 2021-03-25: qty 30, 30d supply, fill #0

## 2021-03-25 MED ORDER — HYDROCHLOROTHIAZIDE 25 MG PO TABS
ORAL_TABLET | ORAL | 0 refills | Status: DC
  Filled 2021-03-25: qty 15, 30d supply, fill #0

## 2021-03-30 ENCOUNTER — Other Ambulatory Visit: Payer: Self-pay

## 2021-03-30 ENCOUNTER — Emergency Department (HOSPITAL_COMMUNITY): Payer: Commercial Managed Care - PPO

## 2021-03-30 ENCOUNTER — Emergency Department (HOSPITAL_COMMUNITY)
Admission: EM | Admit: 2021-03-30 | Discharge: 2021-03-30 | Disposition: A | Discharge status: Home / Self Care | Payer: Commercial Managed Care - PPO | Attending: Emergency Medicine | Admitting: Emergency Medicine

## 2021-03-30 ENCOUNTER — Encounter (HOSPITAL_COMMUNITY): Payer: Self-pay

## 2021-03-30 DIAGNOSIS — E871 Hypo-osmolality and hyponatremia: Secondary | ICD-10-CM | POA: Diagnosis not present

## 2021-03-30 DIAGNOSIS — F17210 Nicotine dependence, cigarettes, uncomplicated: Secondary | ICD-10-CM | POA: Diagnosis not present

## 2021-03-30 DIAGNOSIS — Z79899 Other long term (current) drug therapy: Secondary | ICD-10-CM | POA: Diagnosis not present

## 2021-03-30 DIAGNOSIS — R739 Hyperglycemia, unspecified: Secondary | ICD-10-CM | POA: Diagnosis not present

## 2021-03-30 DIAGNOSIS — R112 Nausea with vomiting, unspecified: Secondary | ICD-10-CM | POA: Insufficient documentation

## 2021-03-30 DIAGNOSIS — Z20822 Contact with and (suspected) exposure to covid-19: Secondary | ICD-10-CM | POA: Insufficient documentation

## 2021-03-30 DIAGNOSIS — R1084 Generalized abdominal pain: Secondary | ICD-10-CM | POA: Diagnosis present

## 2021-03-30 DIAGNOSIS — I10 Essential (primary) hypertension: Secondary | ICD-10-CM | POA: Insufficient documentation

## 2021-03-30 LAB — COMPREHENSIVE METABOLIC PANEL
ALT: 51 U/L — ABNORMAL HIGH (ref 0–44)
AST: 29 U/L (ref 15–41)
Albumin: 4.4 g/dL (ref 3.5–5.0)
Alkaline Phosphatase: 65 U/L (ref 38–126)
Anion gap: 13 (ref 5–15)
BUN: 14 mg/dL (ref 6–20)
CO2: 23 mmol/L (ref 22–32)
Calcium: 9.5 mg/dL (ref 8.9–10.3)
Chloride: 97 mmol/L — ABNORMAL LOW (ref 98–111)
Creatinine, Ser: 1.02 mg/dL (ref 0.61–1.24)
GFR, Estimated: >60 mL/min (ref >60)
Glucose, Bld: 160 mg/dL — ABNORMAL HIGH (ref 70–99)
Potassium: 3.5 mmol/L (ref 3.5–5.1)
Sodium: 133 mmol/L — ABNORMAL LOW (ref 135–145)
Total Bilirubin: 1.2 mg/dL (ref 0.3–1.2)
Total Protein: 7.5 g/dL (ref 6.5–8.1)

## 2021-03-30 LAB — CBC
HCT: 41.1 % (ref 39.0–52.0)
Hemoglobin: 14.1 g/dL (ref 13.0–17.0)
MCH: 32.9 pg (ref 26.0–34.0)
MCHC: 34.3 g/dL (ref 30.0–36.0)
MCV: 95.8 fL (ref 80.0–100.0)
Platelets: 252 10*3/uL (ref 150–400)
RBC: 4.29 MIL/uL (ref 4.22–5.81)
RDW: 11.3 % — ABNORMAL LOW (ref 11.5–15.5)
WBC: 10.4 10*3/uL (ref 4.0–10.5)
nRBC: 0 % (ref 0.0–0.2)

## 2021-03-30 LAB — URINALYSIS, ROUTINE W REFLEX MICROSCOPIC
Bilirubin Urine: NEGATIVE
Glucose, UA: NEGATIVE mg/dL
Hgb urine dipstick: NEGATIVE
Ketones, ur: 20 mg/dL — AB
Leukocytes,Ua: NEGATIVE
Nitrite: NEGATIVE
Protein, ur: NEGATIVE mg/dL
Specific Gravity, Urine: 1.018 (ref 1.005–1.030)
pH: 8 (ref 5.0–8.0)

## 2021-03-30 LAB — RESP PANEL BY RT-PCR (FLU A&B, COVID) ARPGX2
Influenza A by PCR: NEGATIVE
Influenza B by PCR: NEGATIVE
SARS Coronavirus 2 by RT PCR: NEGATIVE

## 2021-03-30 LAB — RAPID URINE DRUG SCREEN, HOSP PERFORMED
Amphetamines: NOT DETECTED
Barbiturates: NOT DETECTED
Benzodiazepines: NOT DETECTED
Cocaine: NOT DETECTED
Opiates: NOT DETECTED
Tetrahydrocannabinol: NOT DETECTED

## 2021-03-30 LAB — ETHANOL: Alcohol, Ethyl (B): <10 mg/dL (ref <10)

## 2021-03-30 LAB — LIPASE, BLOOD: Lipase: 28 U/L (ref 11–51)

## 2021-03-30 MED ORDER — LORAZEPAM 2 MG/ML IJ SOLN
0.5000 mg | Freq: Once | INTRAMUSCULAR | Status: AC
  Administered 2021-03-30: 11:00:00 0.5 mg via INTRAVENOUS
  Filled 2021-03-30: qty 1

## 2021-03-30 MED ORDER — DROPERIDOL 2.5 MG/ML IJ SOLN
1.2500 mg | Freq: Once | INTRAMUSCULAR | Status: AC
  Administered 2021-03-30: 10:00:00 1.25 mg via INTRAVENOUS
  Filled 2021-03-30: qty 2

## 2021-03-30 MED ORDER — IOHEXOL 350 MG/ML SOLN
80.0000 mL | Freq: Once | INTRAVENOUS | Status: AC | PRN
  Administered 2021-03-30: 11:00:00 80 mL via INTRAVENOUS

## 2021-03-30 MED ORDER — SODIUM CHLORIDE (PF) 0.9 % IJ SOLN
INTRAMUSCULAR | Status: AC
  Filled 2021-03-30: qty 50

## 2021-03-30 MED ORDER — LORAZEPAM 1 MG PO TABS
1.0000 mg | ORAL_TABLET | Freq: Once | ORAL | Status: AC
  Administered 2021-03-30: 10:00:00 1 mg via ORAL
  Filled 2021-03-30: qty 1

## 2021-03-30 MED ORDER — ONDANSETRON HCL 4 MG PO TABS
4.0000 mg | ORAL_TABLET | Freq: Four times a day (QID) | ORAL | 0 refills | Status: DC
  Filled 2021-03-30: qty 9, 3d supply, fill #0

## 2021-03-30 MED ORDER — SODIUM CHLORIDE 0.9 % IV BOLUS
1000.0000 mL | Freq: Once | INTRAVENOUS | Status: AC
  Administered 2021-03-30: 10:00:00 1000 mL via INTRAVENOUS

## 2021-03-30 NOTE — ED Triage Notes (Signed)
Pt brought in by ems for N/V/D and abd pain starting last night. Pt denies sick contacts.

## 2021-03-30 NOTE — ED Notes (Signed)
Pt states understanding of dc instructions, importance of follow up, and prescription. Pt denies questions or concerns upon dc. Pt wheeled out of ed via wheelchair by ed tech. No belongings left in room upon dc.  

## 2021-03-30 NOTE — ED Provider Notes (Signed)
[facility]-EMERGENCY DEPT Provider Note   CSN: [id number] Arrival date & time: 03/30/21  8657     History Chief Complaint  Patient presents with   Abdominal Pain   Emesis   Diarrhea    Joshua Blake. is a 50 y.o. male.  HPI 50 yo male ho alcohol abuse, detox from etoh at [facility] d/c'd THursday-4 days ago.  D/c'd on regular medicine.  ETOH abuse syndrome for over 10 years.  No prior detox, no withdrawal reported when hospitalized.  OK until today when started began having diffuse sharp pain, 9-10.  Nothing makes it better or worse.  Ate late last night at 9 p- had hamburger.  Denies any similar symptoms in past.   Grandchildren with some fever, no GI symptoms. No fever, chest pain, no uti symptoms, stools with some loose stools. No prior surgery of abdomen.     Past Medical History:  Diagnosis Date   Frequency of urination and polyuria 02/27/2015   Hypertension    Libido, decreased 04/22/2016   Palpitations 02/15/2017   Testosterone deficiency in male    Tobacco dependence 04/03/2015    Patient Active Problem List   Diagnosis Date Noted   Palpitations 02/15/2017   Libido, decreased 04/22/2016   Tobacco dependence 04/03/2015   Essential hypertension 02/27/2015   Frequency of urination and polyuria 02/27/2015    Past Surgical History:  Procedure Laterality Date   FOOT SURGERY         Family History  Problem Relation Age of Onset   Hypertension Mother    Hypertension Father    Diabetes Maternal Grandmother     Social History   Tobacco Use   Smoking status: Every Day    Packs/day: 1.50    Types: Cigarettes   Smokeless tobacco: Current  Vaping Use   Vaping Use: Never used  Substance Use Topics   Alcohol use: Yes    Alcohol/week: 42.0 standard drinks    Types: 42 Cans of beer per week    Comment: daily   Drug use: No    Home Medications Prior to Admission medications   Medication Sig Start Date End Date Taking?  Authorizing Provider  amLODipine (NORVASC) 5 MG tablet Take 1 tablet (5 mg total) by mouth daily. 02/24/21 02/24/22 Yes Passmore, Enid Derry I, NP  buPROPion (WELLBUTRIN SR) 150 MG 12 hr tablet Take 1 tablet (150 mg total) by mouth 2 (two) times daily. Patient taking differently: Take 150 mg by mouth daily. 02/24/21 02/24/22 Yes Passmore, Enid Derry I, NP  cyclobenzaprine (FLEXERIL) 10 MG tablet Take 1 tablet (10 mg total) by mouth 3 (three) times daily as needed for muscle spasms. 02/24/21  Yes Passmore, Enid Derry I, NP  diclofenac (VOLTAREN) 75 MG EC tablet Take 1 tablet (75 mg total) by mouth 2 (two) times daily as needed. May take as needed once finished with steroid taper Patient taking differently: Take 75 mg by mouth 2 (two) times daily as needed for mild pain. 02/27/21  Yes Cristie Hem, PA-C  hydrochlorothiazide (HYDRODIURIL) 25 MG tablet Take 1 tablet (25 mg total) by mouth daily. 03/25/21  Yes   ibuprofen (ADVIL) 200 MG tablet Take 200 mg by mouth every 6 (six) hours as needed for headache or mild pain.   Yes [provider]  lisinopril-hydrochlorothiazide (ZESTORETIC) 10-12.5 MG tablet Take 1 tablet by mouth daily. 02/24/21 02/24/22 Yes Passmore, Enid Derry I, NP  ondansetron (ZOFRAN) 4 MG tablet Take 1 tablet (4 mg total) by mouth  every 6 (six) hours. 03/30/21  Yes Margarita Grizzle, MD  acetaminophen (TYLENOL) 500 MG tablet Take 1 tablet (500 mg total) by mouth 2 (two) times daily. Patient not taking: Reported on 03/30/2021 03/07/19   Kallie Locks, FNP  amLODipine (NORVASC) 10 MG tablet Take 0.5 tablets (5 mg total) by mouth daily. Patient not taking: Reported on 03/30/2021 03/25/21     amLODipine (NORVASC) 5 MG tablet Take 1 tablet (5 mg total) by mouth daily. Patient not taking: Reported on 03/30/2021 03/25/21     hydrochlorothiazide (HYDRODIURIL) 25 MG tablet Take 0.5 tablets (12.5 mg total) by mouth daily for 30 days. Patient not taking: Reported on 03/30/2021 03/25/21      lisinopril (ZESTRIL) 5 MG tablet Take 1 tablet (5 mg total) by mouth daily. Patient not taking: Reported on 03/30/2021 03/25/21     predniSONE (DELTASONE) 10 MG tablet Take 6 tablets on day 1, 5 tablets on day 2, 4 tablets on day 3, 3 tablets on day 4, 2 tablets on day 5, 1 tablet on day 6. Do not take in conjunction with any antiinflammatories Patient not taking: Reported on 03/30/2021 02/27/21   Cristie Hem, PA-C    Allergies    Patient has no known allergies.  Review of Systems   Review of Systems  Constitutional:  Positive for chills.  HENT: Negative.    Eyes: Negative.   Respiratory: Negative.    Cardiovascular: Negative.   Gastrointestinal:  Positive for abdominal pain, nausea and vomiting.  Endocrine: Negative.   Genitourinary: Negative.   Musculoskeletal: Negative.   Skin: Negative.   Allergic/Immunologic: Negative.   Neurological: Negative.   Hematological: Negative.   Psychiatric/Behavioral: Negative.     Physical Exam Updated Vital Signs BP (!) 165/98   Pulse 83   Temp (!) 97.4 F (36.3 C) (Oral)   Resp 19   Ht 1.854 m (6\' 1" )   Wt 89 kg   SpO2 100%   BMI 25.89 kg/m   Physical Exam Vitals and nursing note reviewed.  Constitutional:      General: He is not in acute distress.    Appearance: He is well-developed. He is ill-appearing.  HENT:     Head: Normocephalic.     Mouth/Throat:     Mouth: Mucous membranes are moist.  Eyes:     Extraocular Movements: Extraocular movements intact.  Cardiovascular:     Rate and Rhythm: Normal rate and regular rhythm.     Heart sounds: Normal heart sounds.  Pulmonary:     Effort: Pulmonary effort is normal.     Breath sounds: Normal breath sounds.  Abdominal:     General: Abdomen is flat. Bowel sounds are normal.     Palpations: Abdomen is soft.     Tenderness: There is generalized abdominal tenderness.     Hernia: No hernia is present.  Skin:    General: Skin is warm and dry.     Capillary Refill:  Capillary refill takes less than 2 seconds.  Neurological:     General: No focal deficit present.     Mental Status: He is alert.  Psychiatric:        Mood and Affect: Mood normal.        Behavior: Behavior normal.    ED Results / Procedures / Treatments   Labs (all labs ordered are listed, but only abnormal results are displayed) Labs Reviewed  CBC - Abnormal; Notable for the following components:      Result Value  RDW 11.3 (*)    All other components within normal limits  COMPREHENSIVE METABOLIC PANEL - Abnormal; Notable for the following components:   Sodium 133 (*)    Chloride 97 (*)    Glucose, Bld 160 (*)    ALT 51 (*)    All other components within normal limits  URINALYSIS, ROUTINE W REFLEX MICROSCOPIC - Abnormal; Notable for the following components:   Ketones, ur 20 (*)    All other components within normal limits  RESP PANEL BY RT-PCR (FLU A&B, COVID) ARPGX2  LIPASE, BLOOD  ETHANOL  RAPID URINE DRUG SCREEN, HOSP PERFORMED    EKG None  Radiology CT ABDOMEN PELVIS W CONTRAST  Result Date: 03/30/2021 CLINICAL DATA:  Abdominal pain, acute, nonlocalized. Symptoms began last night. EXAM: CT ABDOMEN AND PELVIS WITH CONTRAST TECHNIQUE: Multidetector CT imaging of the abdomen and pelvis was performed using the standard protocol following bolus administration of intravenous contrast. CONTRAST:  84mL OMNIPAQUE IOHEXOL 350 MG/ML SOLN COMPARISON:  None. FINDINGS: Lower chest: Normal Hepatobiliary: Diffuse fatty change of the liver. No focal lesion. No calcified gallstones. Pancreas: Normal Spleen: Normal Adrenals/Urinary Tract: Adrenal glands are normal. Kidneys are normal. Bladder is normal. Stomach/Bowel: Stomach and small intestine are normal. Normal appendix. No visible colon pathology. Vascular/Lymphatic: Aorta and IVC are normal.  No adenopathy. Reproductive: Normal Other: No free fluid or air. Musculoskeletal: Minimal lower lumbar degenerative changes. IMPRESSION:  Diffuse fatty change of the liver which could be painful. No focal finding otherwise affecting the hepatobiliary system. No evidence of bowel pathology.  Normal appearing appendix. Electronically Signed   By: Paulina Fusi M.D.   On: 03/30/2021 11:39    Procedures Procedures   Medications Ordered in ED Medications  sodium chloride (PF) 0.9 % injection (has no administration in time range)  sodium chloride 0.9 % bolus 1,000 mL (0 mLs Intravenous Stopped 03/30/21 1041)  droperidol (INAPSINE) 2.5 MG/ML injection 1.25 mg (1.25 mg Intravenous Given 03/30/21 0941)  LORazepam (ATIVAN) tablet 1 mg (1 mg Oral Given 03/30/21 1000)  LORazepam (ATIVAN) injection 0.5 mg (0.5 mg Intravenous Given 03/30/21 1045)  iohexol (OMNIPAQUE) 350 MG/ML injection 80 mL (80 mLs Intravenous Contrast Given 03/30/21 1119)    ED Course  I have reviewed the triage vital signs and the nursing notes.  Pertinent labs & imaging results that were available during my care of the patient were reviewed by me and considered in my medical decision making (see chart for details).  Clinical Course as of 03/30/21 1245  Mon Mar 30, 2021  1027 CBC reviewed and normal Cmet reviewed with mild hyponatremia and elevated glucose at 160 Etoh and uds negative Lipase normal Respiratory panel negative  [DR]  1031 Patient resting comfortably.  Feels improved with pain decreased to 7.  Continues hypertensive.  Will give additional ativan at 0.5 iv [DR]    Clinical Course User Index [DR] Margarita Grizzle, MD   MDM Rules/Calculators/A&P                           Patient with recent etoh detox presents today with abdominal pain nausea and vomiting.  Labs normal except hyperglycemia and mild hyponatremia.  Patient given fluid, antiemetic and ativan.   Abdomen better CT pending Discussed results with patient, wife and mother (via phone) No evidence of acute liver failure or kidney failure as discussed although fatty infiltration of liver  noted on ct- patient advised to avoid alcohol, acetaminophen, or anything else that  is metabolized through the liver. He is advised regarding follow-up.  He is advised regarding conservative therapy at home and return precautions. Final Clinical Impression(s) / ED Diagnoses Final diagnoses:  Nausea and vomiting, unspecified vomiting type  Hyponatremia  Hyperglycemia    Rx / DC Orders ED Discharge Orders          Ordered    ondansetron (ZOFRAN) 4 MG tablet  Every 6 hours        03/30/21 1244             Margarita Grizzle, MD 03/30/21 1245

## 2021-03-30 NOTE — Discharge Instructions (Addendum)
Please use clear liquids as discussed such as Gatorade mixed half-and-half with water.  Use medication for nausea as prescribed. Avoid acetaminophen or anything else metabolized by the liver Do not drink alcohol Please recheck with your doctor next week for recheck of your blood sugar and sodium levels. Take your blood pressure medicines when you get home and have your blood pressure rechecked with your doctor Return the emergency department if you are unable to tolerate liquids or have return of abdominal pain or other new or worsening symptoms

## 2021-04-02 ENCOUNTER — Emergency Department (HOSPITAL_COMMUNITY)
Admission: EM | Admit: 2021-04-02 | Discharge: 2021-04-02 | Discharge status: Against Medical Advice | Payer: Commercial Managed Care - PPO | Source: Home / Self Care

## 2021-04-02 ENCOUNTER — Telehealth: Payer: Self-pay

## 2021-04-02 NOTE — Telephone Encounter (Signed)
Patient wife stated they are now at [facility] ED

## 2021-04-02 NOTE — Telephone Encounter (Signed)
Pt has been in and out of the hospital and pt continues to be sick.  Wife is asking for you to contact her to get admitted to hospital!

## 2021-07-16 ENCOUNTER — Telehealth: Payer: Self-pay

## 2021-07-16 NOTE — Telephone Encounter (Signed)
Patient needs a follow up appt scheduled before refills are given. ?

## 2021-09-04 ENCOUNTER — Other Ambulatory Visit: Payer: Self-pay | Admitting: Nurse Practitioner

## 2021-09-04 DIAGNOSIS — Z720 Tobacco use: Secondary | ICD-10-CM

## 2021-09-04 DIAGNOSIS — I10 Essential (primary) hypertension: Secondary | ICD-10-CM

## 2021-09-04 DIAGNOSIS — E559 Vitamin D deficiency, unspecified: Secondary | ICD-10-CM

## 2021-09-15 ENCOUNTER — Encounter (HOSPITAL_COMMUNITY): Payer: Self-pay | Admitting: Emergency Medicine

## 2021-09-15 ENCOUNTER — Emergency Department (HOSPITAL_COMMUNITY)
Admission: EM | Admit: 2021-09-15 | Discharge: 2021-09-15 | Disposition: A | Discharge status: Home / Self Care | Payer: BC Managed Care – PPO | Attending: Emergency Medicine | Admitting: Emergency Medicine

## 2021-09-15 ENCOUNTER — Emergency Department (HOSPITAL_COMMUNITY): Payer: BC Managed Care – PPO

## 2021-09-15 ENCOUNTER — Other Ambulatory Visit: Payer: Self-pay

## 2021-09-15 DIAGNOSIS — R109 Unspecified abdominal pain: Secondary | ICD-10-CM | POA: Diagnosis not present

## 2021-09-15 DIAGNOSIS — H209 Unspecified iridocyclitis: Secondary | ICD-10-CM | POA: Diagnosis not present

## 2021-09-15 DIAGNOSIS — Y92410 Unspecified street and highway as the place of occurrence of the external cause: Secondary | ICD-10-CM | POA: Diagnosis not present

## 2021-09-15 DIAGNOSIS — I10 Essential (primary) hypertension: Secondary | ICD-10-CM | POA: Diagnosis not present

## 2021-09-15 DIAGNOSIS — S01511A Laceration without foreign body of lip, initial encounter: Secondary | ICD-10-CM | POA: Diagnosis not present

## 2021-09-15 DIAGNOSIS — Z20822 Contact with and (suspected) exposure to covid-19: Secondary | ICD-10-CM | POA: Insufficient documentation

## 2021-09-15 DIAGNOSIS — Z79899 Other long term (current) drug therapy: Secondary | ICD-10-CM | POA: Diagnosis not present

## 2021-09-15 DIAGNOSIS — S0990XA Unspecified injury of head, initial encounter: Secondary | ICD-10-CM | POA: Diagnosis not present

## 2021-09-15 DIAGNOSIS — R55 Syncope and collapse: Secondary | ICD-10-CM

## 2021-09-15 DIAGNOSIS — S0011XA Contusion of right eyelid and periocular area, initial encounter: Secondary | ICD-10-CM

## 2021-09-15 DIAGNOSIS — R079 Chest pain, unspecified: Secondary | ICD-10-CM | POA: Insufficient documentation

## 2021-09-15 HISTORY — DX: Syncope and collapse: R55

## 2021-09-15 LAB — URINALYSIS, ROUTINE W REFLEX MICROSCOPIC
Bacteria, UA: NONE SEEN
Bilirubin Urine: NEGATIVE
Glucose, UA: NEGATIVE mg/dL
Ketones, ur: 5 mg/dL — AB
Leukocytes,Ua: NEGATIVE
Nitrite: NEGATIVE
Protein, ur: NEGATIVE mg/dL
Specific Gravity, Urine: 1.013 (ref 1.005–1.030)
pH: 5 (ref 5.0–8.0)

## 2021-09-15 LAB — I-STAT CHEM 8, ED
BUN: 6 mg/dL (ref 6–20)
Calcium, Ion: 1.16 mmol/L (ref 1.15–1.40)
Chloride: 99 mmol/L (ref 98–111)
Creatinine, Ser: 1 mg/dL (ref 0.61–1.24)
Glucose, Bld: 129 mg/dL — ABNORMAL HIGH (ref 70–99)
HCT: 45 % (ref 39.0–52.0)
Hemoglobin: 15.3 g/dL (ref 13.0–17.0)
Potassium: 3.7 mmol/L (ref 3.5–5.1)
Sodium: 134 mmol/L — ABNORMAL LOW (ref 135–145)
TCO2: 23 mmol/L (ref 22–32)

## 2021-09-15 LAB — COMPREHENSIVE METABOLIC PANEL
ALT: 59 U/L — ABNORMAL HIGH (ref 0–44)
AST: 64 U/L — ABNORMAL HIGH (ref 15–41)
Albumin: 4.2 g/dL (ref 3.5–5.0)
Alkaline Phosphatase: 62 U/L (ref 38–126)
Anion gap: 11 (ref 5–15)
BUN: 6 mg/dL (ref 6–20)
CO2: 23 mmol/L (ref 22–32)
Calcium: 9.4 mg/dL (ref 8.9–10.3)
Chloride: 101 mmol/L (ref 98–111)
Creatinine, Ser: 0.96 mg/dL (ref 0.61–1.24)
GFR, Estimated: >60 mL/min (ref >60)
Glucose, Bld: 130 mg/dL — ABNORMAL HIGH (ref 70–99)
Potassium: 3.6 mmol/L (ref 3.5–5.1)
Sodium: 135 mmol/L (ref 135–145)
Total Bilirubin: 1.5 mg/dL — ABNORMAL HIGH (ref 0.3–1.2)
Total Protein: 6.7 g/dL (ref 6.5–8.1)

## 2021-09-15 LAB — CBC
HCT: 40.7 % (ref 39.0–52.0)
Hemoglobin: 13.8 g/dL (ref 13.0–17.0)
MCH: 34.2 pg — ABNORMAL HIGH (ref 26.0–34.0)
MCHC: 33.9 g/dL (ref 30.0–36.0)
MCV: 100.7 fL — ABNORMAL HIGH (ref 80.0–100.0)
Platelets: 190 10*3/uL (ref 150–400)
RBC: 4.04 MIL/uL — ABNORMAL LOW (ref 4.22–5.81)
RDW: 11.9 % (ref 11.5–15.5)
WBC: 6.9 10*3/uL (ref 4.0–10.5)
nRBC: 0 % (ref 0.0–0.2)

## 2021-09-15 LAB — PROTIME-INR
INR: 1 (ref 0.8–1.2)
Prothrombin Time: 13.5 seconds (ref 11.4–15.2)

## 2021-09-15 LAB — SAMPLE TO BLOOD BANK

## 2021-09-15 LAB — TROPONIN I (HIGH SENSITIVITY): Troponin I (High Sensitivity): 10 ng/L (ref <18)

## 2021-09-15 LAB — LACTIC ACID, PLASMA: Lactic Acid, Venous: 2.8 mmol/L (ref 0.5–1.9)

## 2021-09-15 LAB — RESP PANEL BY RT-PCR (FLU A&B, COVID) ARPGX2
Influenza A by PCR: NEGATIVE
Influenza B by PCR: NEGATIVE
SARS Coronavirus 2 by RT PCR: NEGATIVE

## 2021-09-15 MED ORDER — IOHEXOL 300 MG/ML  SOLN
100.0000 mL | Freq: Once | INTRAMUSCULAR | Status: AC | PRN
  Administered 2021-09-15: 100 mL via INTRAVENOUS

## 2021-09-15 MED ORDER — NEOMYCIN-POLYMYXIN-DEXAMETH 3.5-10000-0.1 OP SUSP
1.0000 [drp] | Freq: Four times a day (QID) | OPHTHALMIC | 0 refills | Status: DC
  Filled 2021-09-15: qty 5, 25d supply, fill #0

## 2021-09-15 MED ORDER — LIDOCAINE-EPINEPHRINE (PF) 2 %-1:200000 IJ SOLN: 20.0000 mL | Freq: Once | INTRAMUSCULAR | Status: DC

## 2021-09-15 MED ORDER — FLUORESCEIN SODIUM 1 MG OP STRP
1.0000 | ORAL_STRIP | Freq: Once | OPHTHALMIC | Status: DC
  Filled 2021-09-15: qty 1

## 2021-09-15 MED ORDER — NEOMYCIN-POLYMYXIN-DEXAMETH 3.5-10000-0.1 OP SUSP: 1.0000 [drp] | Freq: Four times a day (QID) | OPHTHALMIC | 0 refills | Status: AC

## 2021-09-15 MED ORDER — FENTANYL CITRATE PF 50 MCG/ML IJ SOSY
50.0000 ug | PREFILLED_SYRINGE | Freq: Once | INTRAMUSCULAR | Status: AC
  Administered 2021-09-15: 50 ug via INTRAVENOUS
  Filled 2021-09-15: qty 1

## 2021-09-15 MED ORDER — TETANUS-DIPHTH-ACELL PERTUSSIS 5-2.5-18.5 LF-MCG/0.5 IM SUSY: 0.5000 mL | PREFILLED_SYRINGE | Freq: Once | INTRAMUSCULAR | Status: DC

## 2021-09-15 MED ORDER — HYDROCODONE-ACETAMINOPHEN 5-325 MG PO TABS
1.0000 | ORAL_TABLET | Freq: Once | ORAL | Status: AC
  Administered 2021-09-15: 1 via ORAL
  Filled 2021-09-15: qty 1

## 2021-09-15 MED ORDER — OXYCODONE-ACETAMINOPHEN 5-325 MG PO TABS: 1.0000 | ORAL_TABLET | Freq: Four times a day (QID) | ORAL | 0 refills | Status: DC | PRN

## 2021-09-15 MED ORDER — TETRACAINE HCL 0.5 % OP SOLN
1.0000 [drp] | Freq: Once | OPHTHALMIC | Status: DC
  Filled 2021-09-15: qty 4

## 2021-09-15 MED ORDER — OXYCODONE-ACETAMINOPHEN 5-325 MG PO TABS
1.0000 | ORAL_TABLET | Freq: Four times a day (QID) | ORAL | 0 refills | Status: DC | PRN
  Filled 2021-09-15: qty 10, 3d supply, fill #0

## 2021-09-15 MED ORDER — HYDROMORPHONE HCL 1 MG/ML IJ SOLN
1.0000 mg | Freq: Once | INTRAMUSCULAR | Status: AC
  Administered 2021-09-15: 1 mg via INTRAVENOUS
  Filled 2021-09-15: qty 1

## 2021-09-15 NOTE — Discharge Instructions (Addendum)
Please follow-up with ophthalmology tomorrow for evaluation in clinic due to concern for traumatic iritis.  Please start taking the Maxitrol drops in your right eye. ?

## 2021-09-15 NOTE — ED Notes (Signed)
DC instructions reviewed with pt. PT verbalized understanding. Pt DC °
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
## 2021-09-15 NOTE — ED Notes (Signed)
Patient transported to CT 
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
## 2021-09-15 NOTE — Procedures (Signed)
I was consulted to assist with the lip laceration repair.  The patient has no other injuries ? ? ?This patient had a through and through lip laceration involving the orbicularis oris muscle, the skin, and the mucosa of the lower lip.  The entire length of the laceration was approximately 7.5 cm and required a multilevel closure with repair of his orbicularis oris muscle. ? ?After consent was obtained the lip was injected with 1% lidocaine with 1 100,000 epinephrine.  It was cleaned with sterile saline and then prepped with Betadine solution.  The orbicularis oris was approximated with 4-0 Prolene sutures x 2.  The mucosa was closed using 4-0 chromic in an interrupted fashion.  The skin of the lip was closed using 4-0 Prolene interrupted sutures. ? ?Cosmetic result was excellent.  The patient tolerated this well without complications.  I placed bacitracin ointment on the wound and asked him to use this twice daily until sutures are removed in 5 days.  He will follow-up with his primary physician for suture removal. ?

## 2021-09-16 ENCOUNTER — Telehealth (INDEPENDENT_AMBULATORY_CARE_PROVIDER_SITE_OTHER): Payer: Self-pay | Admitting: Otolaryngology

## 2021-09-16 ENCOUNTER — Other Ambulatory Visit: Payer: Self-pay

## 2021-09-16 NOTE — Telephone Encounter (Signed)
I was not able to reach the patient.  POD#1 s/p lip repair. Scheduled for follow up in 5-7 days with PCP. ?

## 2021-09-18 ENCOUNTER — Ambulatory Visit (INDEPENDENT_AMBULATORY_CARE_PROVIDER_SITE_OTHER): Payer: BLUE CROSS/BLUE SHIELD | Admitting: Nurse Practitioner

## 2021-09-18 ENCOUNTER — Other Ambulatory Visit: Payer: Self-pay

## 2021-09-18 ENCOUNTER — Encounter: Payer: Self-pay | Admitting: Nurse Practitioner

## 2021-09-18 VITALS — BP 151/104 | HR 71 | Temp 98.0°F | Ht 73.0 in | Wt 203.2 lb

## 2021-09-18 DIAGNOSIS — R519 Headache, unspecified: Secondary | ICD-10-CM | POA: Diagnosis not present

## 2021-09-18 DIAGNOSIS — M25511 Pain in right shoulder: Secondary | ICD-10-CM | POA: Diagnosis not present

## 2021-09-18 DIAGNOSIS — H209 Unspecified iridocyclitis: Secondary | ICD-10-CM | POA: Diagnosis not present

## 2021-09-18 DIAGNOSIS — S01511A Laceration without foreign body of lip, initial encounter: Secondary | ICD-10-CM

## 2021-09-18 DIAGNOSIS — M25512 Pain in left shoulder: Secondary | ICD-10-CM

## 2021-09-18 DIAGNOSIS — R55 Syncope and collapse: Secondary | ICD-10-CM | POA: Diagnosis not present

## 2021-09-18 DIAGNOSIS — S0990XA Unspecified injury of head, initial encounter: Secondary | ICD-10-CM | POA: Diagnosis not present

## 2021-09-18 DIAGNOSIS — H25813 Combined forms of age-related cataract, bilateral: Secondary | ICD-10-CM | POA: Diagnosis not present

## 2021-09-18 DIAGNOSIS — H1131 Conjunctival hemorrhage, right eye: Secondary | ICD-10-CM | POA: Diagnosis not present

## 2021-09-18 MED ORDER — IBUPROFEN 800 MG PO TABS: 800.0000 mg | ORAL_TABLET | Freq: Three times a day (TID) | ORAL | 0 refills | Status: DC | PRN

## 2021-09-18 MED ORDER — TIZANIDINE HCL 4 MG PO TABS: 4.0000 mg | ORAL_TABLET | Freq: Four times a day (QID) | ORAL | 0 refills | Status: DC | PRN

## 2021-09-18 MED ORDER — AMOXICILLIN-POT CLAVULANATE 875-125 MG PO TABS: 1.0000 | ORAL_TABLET | Freq: Two times a day (BID) | ORAL | 0 refills | Status: DC
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
## 2021-09-20 ENCOUNTER — Ambulatory Visit (HOSPITAL_COMMUNITY)
Admission: EM | Admit: 2021-09-20 | Discharge: 2021-09-20 | Disposition: A | Discharge status: Home / Self Care | Payer: BLUE CROSS/BLUE SHIELD | Attending: Family Medicine | Admitting: Family Medicine

## 2021-09-20 ENCOUNTER — Encounter (HOSPITAL_COMMUNITY): Payer: Self-pay | Admitting: *Deleted

## 2021-09-20 ENCOUNTER — Other Ambulatory Visit: Payer: Self-pay

## 2021-09-20 DIAGNOSIS — Z5189 Encounter for other specified aftercare: Secondary | ICD-10-CM

## 2021-09-20 DIAGNOSIS — Z4802 Encounter for removal of sutures: Secondary | ICD-10-CM

## 2021-09-20 DIAGNOSIS — S01511D Laceration without foreign body of lip, subsequent encounter: Secondary | ICD-10-CM
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
## 2021-09-20 NOTE — Discharge Instructions (Addendum)
Continue to care for your wound as you have been doing.

## 2021-09-22 ENCOUNTER — Other Ambulatory Visit: Payer: Self-pay | Admitting: Nurse Practitioner

## 2021-09-22 DIAGNOSIS — M25511 Pain in right shoulder: Secondary | ICD-10-CM

## 2021-09-26 ENCOUNTER — Other Ambulatory Visit: Payer: Self-pay | Admitting: Nurse Practitioner

## 2021-09-26 DIAGNOSIS — M25511 Pain in right shoulder: Secondary | ICD-10-CM

## 2021-09-29 ENCOUNTER — Telehealth: Payer: Self-pay

## 2021-09-29 NOTE — Telephone Encounter (Signed)
Pt was asking about her Neurology referral Also had other questions for clinic staff

## 2021-09-29 NOTE — Telephone Encounter (Signed)
Spoke the patient and he already has an appt scheduled with Neurology on 10/07/21.

## 2021-09-30 ENCOUNTER — Other Ambulatory Visit: Payer: Self-pay | Admitting: Nurse Practitioner

## 2021-09-30 DIAGNOSIS — M25511 Pain in right shoulder: Secondary | ICD-10-CM

## 2021-10-02 ENCOUNTER — Encounter: Payer: Self-pay | Admitting: Nurse Practitioner

## 2021-10-02 ENCOUNTER — Ambulatory Visit (INDEPENDENT_AMBULATORY_CARE_PROVIDER_SITE_OTHER): Payer: BC Managed Care – PPO | Admitting: Nurse Practitioner

## 2021-10-02 VITALS — BP 152/105 | HR 93 | Temp 97.9°F | Ht 73.0 in | Wt 200.2 lb

## 2021-10-02 DIAGNOSIS — M25512 Pain in left shoulder: Secondary | ICD-10-CM | POA: Diagnosis not present

## 2021-10-02 DIAGNOSIS — M25511 Pain in right shoulder: Secondary | ICD-10-CM

## 2021-10-02 MED ORDER — MELOXICAM 7.5 MG PO TABS: 7.5000 mg | ORAL_TABLET | Freq: Every day | ORAL | 0 refills | Status: DC
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
## 2021-10-04 ENCOUNTER — Other Ambulatory Visit: Payer: Self-pay | Admitting: Nurse Practitioner

## 2021-10-04 DIAGNOSIS — M25511 Pain in right shoulder: Secondary | ICD-10-CM

## 2021-10-07 ENCOUNTER — Other Ambulatory Visit: Payer: Self-pay

## 2021-10-07 ENCOUNTER — Encounter: Payer: Self-pay | Admitting: Psychiatry

## 2021-10-07 ENCOUNTER — Ambulatory Visit (INDEPENDENT_AMBULATORY_CARE_PROVIDER_SITE_OTHER): Payer: BC Managed Care – PPO | Admitting: Psychiatry

## 2021-10-07 VITALS — BP 159/116 | HR 81 | Ht 72.0 in | Wt 198.6 lb

## 2021-10-07 DIAGNOSIS — G44319 Acute post-traumatic headache, not intractable: Secondary | ICD-10-CM

## 2021-10-07 DIAGNOSIS — R55 Syncope and collapse: Secondary | ICD-10-CM

## 2021-10-07 DIAGNOSIS — M25511 Pain in right shoulder: Secondary | ICD-10-CM

## 2021-10-07 MED ORDER — AMITRIPTYLINE HCL 25 MG PO TABS: ORAL_TABLET | ORAL | 6 refills | Status: AC

## 2021-10-07 MED ORDER — AMITRIPTYLINE HCL 25 MG PO TABS: 25.0000 mg | ORAL_TABLET | Freq: Every day | ORAL | 6 refills | Status: DC
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
## 2021-10-08 ENCOUNTER — Ambulatory Visit (INDEPENDENT_AMBULATORY_CARE_PROVIDER_SITE_OTHER): Payer: BC Managed Care – PPO | Admitting: Neurology

## 2021-10-08 ENCOUNTER — Other Ambulatory Visit: Payer: Self-pay | Admitting: Nurse Practitioner

## 2021-10-08 DIAGNOSIS — R55 Syncope and collapse: Secondary | ICD-10-CM

## 2021-10-08 DIAGNOSIS — M25511 Pain in right shoulder: Secondary | ICD-10-CM

## 2021-10-08 MED ORDER — TIZANIDINE HCL 4 MG PO TABS: 4.0000 mg | ORAL_TABLET | Freq: Four times a day (QID) | ORAL | 0 refills | Status: DC | PRN

## 2021-10-08 NOTE — Procedures (Signed)
    History:  51 year old man with an episode of loss of consciousness   EEG classification:  Awake and asleep  Description of the recording: The background rhythms of this recording consists of a fairly well modulated medium amplitude background activity of 11-12 Hz. As the record progresses, the patient initially is in the waking state, but appears to enter the early stage II sleep during the recording, with rudimentary sleep spindles and vertex sharp wave activity seen. During the wakeful state, photic stimulation is performed, and no abnormal responses were seen. Hyperventilation was also performed, no abnormal response seen. No epileptiform discharges seen during this recording. There was no focal slowing. EKG monitor shows no evidence of cardiac rhythm abnormalities with a heart rate of 60.  Abnormality: None   Impression: This is a normal EEG recording in the waking and sleeping state. No evidence interictal epileptiform discharges were seen at any time during the recording.  A normal EEG does not exclude a diagnosis of epilepsy.    Windell Norfolk, MD [facility]

## 2021-10-13 ENCOUNTER — Other Ambulatory Visit: Payer: Self-pay

## 2021-10-13 ENCOUNTER — Ambulatory Visit: Payer: BC Managed Care – PPO | Attending: Nurse Practitioner | Admitting: Physical Therapy

## 2021-10-13 ENCOUNTER — Encounter: Payer: Self-pay | Admitting: Physical Therapy

## 2021-10-13 DIAGNOSIS — M25512 Pain in left shoulder: Secondary | ICD-10-CM | POA: Diagnosis not present

## 2021-10-13 DIAGNOSIS — M542 Cervicalgia: Secondary | ICD-10-CM | POA: Insufficient documentation

## 2021-10-13 DIAGNOSIS — G8929 Other chronic pain: Secondary | ICD-10-CM | POA: Insufficient documentation

## 2021-10-13 DIAGNOSIS — M6281 Muscle weakness (generalized): Secondary | ICD-10-CM | POA: Insufficient documentation

## 2021-10-13 DIAGNOSIS — M25511 Pain in right shoulder: Secondary | ICD-10-CM | POA: Diagnosis not present
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
## 2021-10-14 ENCOUNTER — Other Ambulatory Visit: Payer: Self-pay | Admitting: Nurse Practitioner

## 2021-10-14 DIAGNOSIS — M25511 Pain in right shoulder: Secondary | ICD-10-CM

## 2021-10-18 ENCOUNTER — Other Ambulatory Visit: Payer: Self-pay | Admitting: Nurse Practitioner

## 2021-10-18 DIAGNOSIS — M25511 Pain in right shoulder: Secondary | ICD-10-CM

## 2021-10-19 ENCOUNTER — Encounter: Payer: Self-pay | Admitting: Physical Therapy

## 2021-10-19 ENCOUNTER — Ambulatory Visit: Payer: BC Managed Care – PPO | Admitting: Physical Therapy

## 2021-10-19 DIAGNOSIS — M542 Cervicalgia: Secondary | ICD-10-CM

## 2021-10-19 DIAGNOSIS — G8929 Other chronic pain: Secondary | ICD-10-CM

## 2021-10-19 DIAGNOSIS — M25511 Pain in right shoulder: Secondary | ICD-10-CM | POA: Diagnosis not present

## 2021-10-19 DIAGNOSIS — M6281 Muscle weakness (generalized): Secondary | ICD-10-CM | POA: Diagnosis not present

## 2021-10-19 DIAGNOSIS — M25512 Pain in left shoulder: Secondary | ICD-10-CM | POA: Diagnosis not present
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
## 2021-10-26 ENCOUNTER — Ambulatory Visit: Payer: BC Managed Care – PPO | Admitting: Physical Therapy

## 2021-10-26 ENCOUNTER — Encounter: Payer: Self-pay | Admitting: Physical Therapy

## 2021-10-26 DIAGNOSIS — M542 Cervicalgia: Secondary | ICD-10-CM | POA: Diagnosis not present

## 2021-10-26 DIAGNOSIS — G8929 Other chronic pain: Secondary | ICD-10-CM | POA: Diagnosis not present

## 2021-10-26 DIAGNOSIS — M6281 Muscle weakness (generalized): Secondary | ICD-10-CM | POA: Diagnosis not present

## 2021-10-26 DIAGNOSIS — M25511 Pain in right shoulder: Secondary | ICD-10-CM | POA: Diagnosis not present

## 2021-10-26 DIAGNOSIS — M25512 Pain in left shoulder: Secondary | ICD-10-CM | POA: Diagnosis not present

## 2021-10-28 ENCOUNTER — Ambulatory Visit: Payer: BC Managed Care – PPO | Admitting: Physical Therapy

## 2021-10-28 ENCOUNTER — Encounter: Payer: Self-pay | Admitting: Physical Therapy

## 2021-10-28 DIAGNOSIS — M25511 Pain in right shoulder: Secondary | ICD-10-CM | POA: Diagnosis not present

## 2021-10-28 DIAGNOSIS — G8929 Other chronic pain: Secondary | ICD-10-CM

## 2021-10-28 DIAGNOSIS — M25512 Pain in left shoulder: Secondary | ICD-10-CM | POA: Diagnosis not present

## 2021-10-28 DIAGNOSIS — M6281 Muscle weakness (generalized): Secondary | ICD-10-CM

## 2021-10-28 DIAGNOSIS — M542 Cervicalgia: Secondary | ICD-10-CM
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
## 2021-11-05 ENCOUNTER — Encounter: Payer: Self-pay | Admitting: Physical Therapy

## 2021-11-05 ENCOUNTER — Ambulatory Visit: Payer: BC Managed Care – PPO | Attending: Nurse Practitioner | Admitting: Physical Therapy

## 2021-11-05 DIAGNOSIS — M6281 Muscle weakness (generalized): Secondary | ICD-10-CM | POA: Insufficient documentation

## 2021-11-05 DIAGNOSIS — G8929 Other chronic pain: Secondary | ICD-10-CM | POA: Diagnosis not present

## 2021-11-05 DIAGNOSIS — M542 Cervicalgia: Secondary | ICD-10-CM | POA: Diagnosis not present

## 2021-11-05 DIAGNOSIS — M25511 Pain in right shoulder: Secondary | ICD-10-CM | POA: Diagnosis not present

## 2021-11-05 DIAGNOSIS — M25512 Pain in left shoulder: Secondary | ICD-10-CM | POA: Diagnosis not present
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
## 2021-11-06 ENCOUNTER — Other Ambulatory Visit: Payer: Self-pay

## 2021-11-06 DIAGNOSIS — M25511 Pain in right shoulder: Secondary | ICD-10-CM

## 2021-11-06 MED ORDER — MELOXICAM 7.5 MG PO TABS: 7.5000 mg | ORAL_TABLET | Freq: Every day | ORAL | 0 refills | Status: DC

## 2021-11-06 NOTE — Telephone Encounter (Signed)
RF request for Meloxicam LOV: 10/02/21 Next ov: 01/06/22 Last written: 10/02/21(30,0)  Please review and advise. Medication pending

## 2021-11-09 ENCOUNTER — Ambulatory Visit: Payer: BC Managed Care – PPO | Admitting: Physical Therapy

## 2021-11-09 ENCOUNTER — Encounter: Payer: Self-pay | Admitting: Physical Therapy

## 2021-11-09 DIAGNOSIS — G8929 Other chronic pain: Secondary | ICD-10-CM | POA: Diagnosis not present

## 2021-11-09 DIAGNOSIS — M6281 Muscle weakness (generalized): Secondary | ICD-10-CM | POA: Diagnosis not present

## 2021-11-09 DIAGNOSIS — M25511 Pain in right shoulder: Secondary | ICD-10-CM | POA: Diagnosis not present

## 2021-11-09 DIAGNOSIS — M25512 Pain in left shoulder: Secondary | ICD-10-CM | POA: Diagnosis not present

## 2021-11-09 DIAGNOSIS — M542 Cervicalgia: Secondary | ICD-10-CM | POA: Diagnosis not present
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
## 2021-11-11 ENCOUNTER — Ambulatory Visit: Payer: BC Managed Care – PPO | Admitting: Physical Therapy

## 2021-11-11 ENCOUNTER — Encounter: Payer: Self-pay | Admitting: Physical Therapy

## 2021-11-11 DIAGNOSIS — M6281 Muscle weakness (generalized): Secondary | ICD-10-CM

## 2021-11-11 DIAGNOSIS — M25511 Pain in right shoulder: Secondary | ICD-10-CM | POA: Diagnosis not present

## 2021-11-11 DIAGNOSIS — M542 Cervicalgia: Secondary | ICD-10-CM

## 2021-11-11 DIAGNOSIS — G8929 Other chronic pain: Secondary | ICD-10-CM | POA: Diagnosis not present

## 2021-11-11 DIAGNOSIS — M25512 Pain in left shoulder: Secondary | ICD-10-CM | POA: Diagnosis not present
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
## 2021-11-19 ENCOUNTER — Encounter: Payer: Self-pay | Admitting: Physical Therapy

## 2021-11-19 ENCOUNTER — Ambulatory Visit: Payer: BC Managed Care – PPO | Admitting: Physical Therapy

## 2021-11-19 DIAGNOSIS — M542 Cervicalgia: Secondary | ICD-10-CM | POA: Diagnosis not present

## 2021-11-19 DIAGNOSIS — M25511 Pain in right shoulder: Secondary | ICD-10-CM | POA: Diagnosis not present

## 2021-11-19 DIAGNOSIS — M6281 Muscle weakness (generalized): Secondary | ICD-10-CM | POA: Diagnosis not present

## 2021-11-19 DIAGNOSIS — G8929 Other chronic pain: Secondary | ICD-10-CM | POA: Diagnosis not present

## 2021-11-19 DIAGNOSIS — M25512 Pain in left shoulder: Secondary | ICD-10-CM | POA: Diagnosis not present
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
## 2021-11-20 ENCOUNTER — Ambulatory Visit: Payer: BC Managed Care – PPO | Admitting: Physical Therapy

## 2021-11-24 ENCOUNTER — Ambulatory Visit: Payer: BC Managed Care – PPO | Admitting: Physical Therapy

## 2021-11-24 ENCOUNTER — Encounter: Payer: Self-pay | Admitting: Physical Therapy

## 2021-11-24 DIAGNOSIS — M542 Cervicalgia: Secondary | ICD-10-CM

## 2021-11-24 DIAGNOSIS — M25511 Pain in right shoulder: Secondary | ICD-10-CM | POA: Diagnosis not present

## 2021-11-24 DIAGNOSIS — M25512 Pain in left shoulder: Secondary | ICD-10-CM | POA: Diagnosis not present

## 2021-11-24 DIAGNOSIS — G8929 Other chronic pain: Secondary | ICD-10-CM

## 2021-11-24 DIAGNOSIS — M6281 Muscle weakness (generalized): Secondary | ICD-10-CM | POA: Diagnosis not present

## 2021-11-24 NOTE — Patient Instructions (Signed)
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
## 2021-11-26 ENCOUNTER — Ambulatory Visit: Payer: BC Managed Care – PPO | Admitting: Physical Therapy

## 2021-12-01 ENCOUNTER — Ambulatory Visit: Payer: BC Managed Care – PPO | Attending: Nurse Practitioner | Admitting: Physical Therapy

## 2021-12-01 ENCOUNTER — Telehealth: Payer: Self-pay | Admitting: Physical Therapy

## 2021-12-01 DIAGNOSIS — M25511 Pain in right shoulder: Secondary | ICD-10-CM | POA: Insufficient documentation

## 2021-12-01 DIAGNOSIS — M25512 Pain in left shoulder: Secondary | ICD-10-CM | POA: Insufficient documentation

## 2021-12-01 DIAGNOSIS — M6281 Muscle weakness (generalized): Secondary | ICD-10-CM | POA: Insufficient documentation

## 2021-12-01 DIAGNOSIS — M542 Cervicalgia: Secondary | ICD-10-CM | POA: Insufficient documentation

## 2021-12-01 DIAGNOSIS — G8929 Other chronic pain: Secondary | ICD-10-CM | POA: Insufficient documentation

## 2021-12-01 NOTE — Telephone Encounter (Signed)
Called patient regarding missed appointment today. He stated he forgot his appointment today, thinking it was tomorrow. He said he plans to attend his next visit, and confirmed day/ time.  Anavey Coombes PT, DPT, LAT, ATC  12/01/21  1:29 PM

## 2021-12-03 ENCOUNTER — Ambulatory Visit: Payer: BC Managed Care – PPO | Admitting: Physical Therapy

## 2021-12-03 ENCOUNTER — Encounter: Payer: Self-pay | Admitting: Physical Therapy

## 2021-12-03 DIAGNOSIS — G8929 Other chronic pain: Secondary | ICD-10-CM

## 2021-12-03 DIAGNOSIS — M542 Cervicalgia: Secondary | ICD-10-CM

## 2021-12-03 DIAGNOSIS — M25511 Pain in right shoulder: Secondary | ICD-10-CM | POA: Diagnosis not present

## 2021-12-03 DIAGNOSIS — M6281 Muscle weakness (generalized): Secondary | ICD-10-CM

## 2021-12-03 DIAGNOSIS — M25512 Pain in left shoulder: Secondary | ICD-10-CM | POA: Diagnosis not present
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
## 2021-12-15 ENCOUNTER — Encounter: Payer: BC Managed Care – PPO | Admitting: Physical Therapy

## 2021-12-17 ENCOUNTER — Encounter: Payer: BC Managed Care – PPO | Admitting: Physical Therapy

## 2022-01-06 ENCOUNTER — Ambulatory Visit: Payer: Self-pay | Admitting: Nurse Practitioner

## 2022-03-31 ENCOUNTER — Encounter: Payer: Self-pay | Admitting: Psychiatry

## 2022-03-31 ENCOUNTER — Ambulatory Visit: Payer: BC Managed Care – PPO | Admitting: Psychiatry
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
## 2022-05-20 DIAGNOSIS — I10 Essential (primary) hypertension: Secondary | ICD-10-CM | POA: Diagnosis not present

## 2022-05-20 DIAGNOSIS — A084 Viral intestinal infection, unspecified: Secondary | ICD-10-CM | POA: Diagnosis not present

## 2022-05-20 DIAGNOSIS — Z20822 Contact with and (suspected) exposure to covid-19: Secondary | ICD-10-CM | POA: Diagnosis not present

## 2022-07-07 ENCOUNTER — Encounter: Payer: Self-pay | Admitting: Nurse Practitioner

## 2022-07-07 ENCOUNTER — Ambulatory Visit (INDEPENDENT_AMBULATORY_CARE_PROVIDER_SITE_OTHER): Payer: BC Managed Care – PPO | Admitting: Nurse Practitioner

## 2022-07-07 VITALS — BP 128/92 | HR 97 | Temp 99.2°F | Ht 72.0 in | Wt 200.8 lb

## 2022-07-07 DIAGNOSIS — I10 Essential (primary) hypertension: Secondary | ICD-10-CM | POA: Diagnosis not present

## 2022-07-07 DIAGNOSIS — M25552 Pain in left hip: Secondary | ICD-10-CM | POA: Diagnosis not present

## 2022-07-07 DIAGNOSIS — Z1211 Encounter for screening for malignant neoplasm of colon: Secondary | ICD-10-CM

## 2022-07-07 DIAGNOSIS — M25511 Pain in right shoulder: Secondary | ICD-10-CM | POA: Diagnosis not present

## 2022-07-07 DIAGNOSIS — G8929 Other chronic pain: Secondary | ICD-10-CM

## 2022-07-07 DIAGNOSIS — F1010 Alcohol abuse, uncomplicated: Secondary | ICD-10-CM

## 2022-07-07 DIAGNOSIS — Z131 Encounter for screening for diabetes mellitus: Secondary | ICD-10-CM

## 2022-07-07 DIAGNOSIS — Z716 Tobacco abuse counseling: Secondary | ICD-10-CM

## 2022-07-07 MED ORDER — CYCLOBENZAPRINE HCL 5 MG PO TABS: 5.0000 mg | ORAL_TABLET | Freq: Three times a day (TID) | ORAL | 1 refills | Status: AC | PRN

## 2022-07-07 MED ORDER — KETOROLAC TROMETHAMINE 30 MG/ML IJ SOLN
30.0000 mg | Freq: Once | INTRAMUSCULAR | Status: AC
  Administered 2022-07-07: 30 mg via INTRAMUSCULAR

## 2022-07-07 MED ORDER — METHYLPREDNISOLONE SODIUM SUCC 40 MG IJ SOLR: 40.0000 mg | Freq: Once | INTRAMUSCULAR | 0 refills | Status: AC

## 2022-07-07 MED ORDER — METHYLPREDNISOLONE SODIUM SUCC 40 MG IJ SOLR
40.0000 mg | Freq: Once | INTRAMUSCULAR | Status: AC
  Administered 2022-07-07: 40 mg via INTRAMUSCULAR

## 2022-07-07 MED ORDER — MELOXICAM 7.5 MG PO TABS: 7.5000 mg | ORAL_TABLET | Freq: Every day | ORAL | 0 refills | Status: AC
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
## 2022-07-07 NOTE — Assessment & Plan Note (Signed)
Need to avoid drinking alcohol including risk of liver disease discussed with the patient

## 2022-07-07 NOTE — Assessment & Plan Note (Signed)
Smokes about 2 pack/day  Asked about quitting: confirms that he/she currently smokes cigarettes Advise to quit smoking: Educated about QUITTING to reduce the risk of cancer, cardio and cerebrovascular disease. Assess willingness: Unwilling to quit at this time, but is working on cutting back. Assist with counseling and pharmacotherapy: Counseled for 5 minutes and literature provided. Arrange for follow up: follow up in 1 months and continue to offer help.

## 2022-07-07 NOTE — Assessment & Plan Note (Signed)
BP Readings from Last 3 Encounters:  07/07/22 (!) 128/92  10/07/21 (!) 159/116  10/02/21 (!) 152/105  Has amlodipine 5 mg daily, lisinopril-hydrochlorothiazide 10-12.5 mg daily ordered Today that he has not been taking the medications consistently Need to take both medications daily as ordered discussed with the patient DASH diet advised engage in regular moderate exercises as tolerated CMP today

## 2022-07-07 NOTE — Patient Instructions (Signed)
    Screening for colon cancer  - Ambulatory referral to Gastroenterology  4. Chronic right shoulder pain  - meloxicam (MOBIC) 7.5 MG tablet; Take 1 tablet (7.5 mg total) by mouth daily.  Dispense: 30 tablet; Refill: 0 - cyclobenzaprine (FLEXERIL) 5 MG tablet; Take 1 tablet (5 mg total) by mouth 3 (three) times daily as needed for muscle spasms.  Dispense: 30 tablet; Refill: 1 - Ambulatory referral to Orthopedic Surgery - ketorolac (TORADOL) 30 MG/ML injection 30 mg - methylPREDNISolone sodium succinate (SOLU-MEDROL) 40 mg/mL injection; Inject 1 mL (40 mg total) into the vein once for 1 dose.  Dispense: 1 each; Refill: 0  5. Chronic left hip pain  - meloxicam (MOBIC) 7.5 MG tablet; Take 1 tablet (7.5 mg total) by mouth daily.  Dispense: 30 tablet; Refill: 0 - cyclobenzaprine (FLEXERIL) 5 MG tablet; Take 1 tablet (5 mg total) by mouth 3 (three) times daily as needed for muscle spasms.  Dispense: 30 tablet; Refill: 1 - Ambulatory referral to Orthopedic Surgery - ketorolac (TORADOL) 30 MG/ML injection 30 mg - methylPREDNISolone sodium succinate (SOLU-MEDROL) 40 mg/mL injection; Inject 1 mL (40 mg total) into the vein once for 1 dose.  Dispense: 1 each; Refill: 0    It is important that you exercise regularly at least 30 minutes 5 times a week as tolerated  Think about what you will eat, plan ahead. Choose " clean, green, fresh or frozen" over canned, processed or packaged foods which are more sugary, salty and fatty. 70 to 75% of food eaten should be vegetables and fruit. Three meals at set times with snacks allowed between meals, but they must be fruit or vegetables. Aim to eat over a 12 hour period , example 7 am to 7 pm, and STOP after  your last meal of the day. Drink water,generally about 64 ounces per day, no other drink is as healthy. Fruit juice is best enjoyed in a healthy way, by EATING the fruit.  Thanks for choosing [facility] we consider it a privelige to serve you.

## 2022-07-07 NOTE — Assessment & Plan Note (Signed)
  -   meloxicam (MOBIC) 7.5 MG tablet; Take 1 tablet (7.5 mg total) by mouth daily.  Dispense: 30 tablet; Refill: 0 - cyclobenzaprine (FLEXERIL) 5 MG tablet; Take 1 tablet (5 mg total) by mouth 3 (three) times daily as needed for muscle spasms.  Dispense: 30 tablet; Refill: 1 - Ambulatory referral to Orthopedic Surgery - ketorolac (TORADOL) 30 MG/ML injection 30 mg - methylPREDNISolone sodium succinate (SOLU-MEDROL) 40 mg/mL injection; Inject 1 mL (40 mg total) into the vein once for 1 dose.  Dispense: 1 each; Refill: 0 Application of heat and ice stretching exercises encouraged

## 2022-07-07 NOTE — Assessment & Plan Note (Signed)
meloxicam (MOBIC) 7.5 MG tablet; Take 1 tablet (7.5 mg total) by mouth daily.  Dispense: 30 tablet; Refill: 0 - cyclobenzaprine (FLEXERIL) 5 MG tablet; Take 1 tablet (5 mg total) by mouth 3 (three) times daily as needed for muscle spasms.  Dispense: 30 tablet; Refill: 1 - Ambulatory referral to Orthopedic Surgery - ketorolac (TORADOL) 30 MG/ML injection 30 mg - methylPREDNISolone sodium succinate (SOLU-MEDROL) 40 mg/mL injection; Inject 1 mL (40 mg total) into the vein once for 1 dose.  Dispense: 1 each; Refill: 0 Application of heat and ice stretching exercises encouraged

## 2022-07-07 NOTE — Addendum Note (Signed)
Addended byVonzell Schlatter on: 07/07/2022 03:29 PM   Modules accepted: Orders

## 2022-07-08 ENCOUNTER — Ambulatory Visit (HOSPITAL_COMMUNITY)
Admission: RE | Admit: 2022-07-08 | Discharge: 2022-07-08 | Disposition: A | Discharge status: Home / Self Care | Payer: BC Managed Care – PPO | Source: Ambulatory Visit | Attending: Nurse Practitioner | Admitting: Nurse Practitioner

## 2022-07-08 DIAGNOSIS — M25552 Pain in left hip: Secondary | ICD-10-CM | POA: Diagnosis not present

## 2022-07-08 DIAGNOSIS — G8929 Other chronic pain: Secondary | ICD-10-CM | POA: Diagnosis not present

## 2022-07-08 DIAGNOSIS — M25511 Pain in right shoulder: Secondary | ICD-10-CM | POA: Diagnosis not present

## 2022-07-08 LAB — CMP14+EGFR
ALT: 25 IU/L (ref 0–44)
AST: 34 IU/L (ref 0–40)
Albumin/Globulin Ratio: 2.3 — ABNORMAL HIGH (ref 1.2–2.2)
Albumin: 5.4 g/dL — ABNORMAL HIGH (ref 4.1–5.1)
Alkaline Phosphatase: 87 IU/L (ref 44–121)
BUN/Creatinine Ratio: 9 (ref 9–20)
BUN: 9 mg/dL (ref 6–24)
Bilirubin Total: 1.1 mg/dL (ref 0.0–1.2)
CO2: 23 mmol/L (ref 20–29)
Calcium: 10.4 mg/dL — ABNORMAL HIGH (ref 8.7–10.2)
Chloride: 99 mmol/L (ref 96–106)
Creatinine, Ser: 1.01 mg/dL (ref 0.76–1.27)
Globulin, Total: 2.4 g/dL (ref 1.5–4.5)
Glucose: 97 mg/dL (ref 70–99)
Potassium: 4.7 mmol/L (ref 3.5–5.2)
Sodium: 140 mmol/L (ref 134–144)
Total Protein: 7.8 g/dL (ref 6.0–8.5)
eGFR: 91 mL/min/{1.73_m2} (ref >59)

## 2022-07-08 LAB — HEMOGLOBIN A1C
Est. average glucose Bld gHb Est-mCnc: 97 mg/dL
Hgb A1c MFr Bld: 5 % (ref 4.8–5.6)

## 2022-07-12 NOTE — Progress Notes (Signed)
Called pt and inform results. [place]

## 2022-07-12 NOTE — Progress Notes (Signed)
Avascular necrosis occurs when blood flow to a bone is interrupted or reduced. Please limit use of alcohol and quit smoking

## 2022-07-16 ENCOUNTER — Ambulatory Visit (INDEPENDENT_AMBULATORY_CARE_PROVIDER_SITE_OTHER): Payer: BC Managed Care – PPO | Admitting: Student

## 2022-07-16 DIAGNOSIS — M25511 Pain in right shoulder: Secondary | ICD-10-CM | POA: Diagnosis not present

## 2022-07-16 DIAGNOSIS — M7541 Impingement syndrome of right shoulder: Secondary | ICD-10-CM

## 2022-07-16 DIAGNOSIS — M25552 Pain in left hip: Secondary | ICD-10-CM
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
## 2022-08-06 ENCOUNTER — Ambulatory Visit: Payer: Self-pay | Admitting: Nurse Practitioner

## 2022-08-11 ENCOUNTER — Ambulatory Visit: Payer: BC Managed Care – PPO | Admitting: Nurse Practitioner

## 2022-08-18 ENCOUNTER — Ambulatory Visit (HOSPITAL_BASED_OUTPATIENT_CLINIC_OR_DEPARTMENT_OTHER): Payer: BC Managed Care – PPO | Admitting: Orthopaedic Surgery
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
## 2023-01-27 ENCOUNTER — Other Ambulatory Visit: Payer: BC Managed Care – PPO | Admitting: Pharmacist

## 2023-01-27 NOTE — Patient Instructions (Signed)
Lorella Nimrod,  Thank you for speaking with me today! As discussed, you are close to the blood pressure goal - getting a bit more consistent with your medications will likely do the trick with seeing your numbers improve to prevent future heart attacks and strokes.  Don't forget to look for an upper arm blood pressure cuff, "omron" brand, should be ~$30-40 at most pharmacies.  Below are some tips with checking blood pressure:  Check your blood pressure periodically, and any time you have concerning symptoms like headache, chest pain, dizziness, shortness of breath, or vision changes.   Our goal is less than 140/90.  To appropriately check your blood pressure, make sure you do the following:  1) Avoid caffeine, exercise, or tobacco products for 30 minutes before checking. Empty your bladder. 2) Sit with your back supported in a flat-backed chair. Rest your arm on something flat (arm of the chair, table, etc). 3) Sit still with your feet flat on the floor, resting, for at least 5 minutes.  4) Check your blood pressure. Take 1-2 readings.  5) Write down these readings and bring with you to any provider appointments.  Bring your home blood pressure machine with you to a provider's office for accuracy comparison at least once a year.   Make sure you take your blood pressure medications before you come to any office visit, even if you were asked to fast for labs.   Take care, Elmarie Shiley, PharmD, BCPS Clinical Pharmacist [facility]

## 2023-01-27 NOTE — Progress Notes (Signed)
01/27/2023 Name: Joshua Blake. MRN: [id number] DOB: 1971/02/09  Patient appearing on report for [employer] [facility] - Hypertension Control report due to last documented ambulatory blood pressure of 128/92 on 07/07/22. Next appointment with PCP is not scheduled   Outreached patient to discuss hypertension control and medication management.   Current antihypertensives: amlodipine 5mg  daily (not taking regularly per patient), prescribed lisinopril-hydrochlorothiazide 10-12.5mg  daily (not taking at all at this time).  Patient does not have an automated upper arm home BP machine.  Current blood pressure readings: unable to check at this time  Patient denies hypotensive signs and symptoms including dizziness, lightheadedness.  Patient denies hypertensive symptoms including headache, chest pain, shortness of breath.  Patient denies side effects related to medication. Barrier to adherence is shiftwork and challenge to keep a consistent routine.     Assessment/Plan: - Currently uncontrolled, but very close. - - Reviewed goal blood pressure <140/90 - Reviewed appropriate administration of medication regimen - Counseled on long term microvascular and macrovascular complications of uncontrolled hypertension - Reviewed appropriate home BP monitoring technique (avoid caffeine, smoking, and exercise for 30 minutes before checking, rest for at least 5 minutes before taking BP, sit with feet flat on the floor and back against a hard surface, uncross legs, and rest arm on flat surface) - Discussed dietary modifications, such as reduced salt intake, focus on whole grains, vegetables, lean proteins - Discussed goal of 150 minutes of moderate intensity physical activity weekly - Reviewed strategies to improve medication adherence such as setting phone reminder or alarm, setting medication bottle beside coffee pot, habit stacking technique. - Recommend continue current regimen, reinforced patient  implement consistent blood pressure medication, and notes he will have his wife obtain refill for lisinopril-hydrochlorothiazide 10-12.5mg  to get back on track. - Counseled on obtaining upper arm "omron" brand BP cuff for home monitoring.  Lynnda Shields, PharmD, BCPS Clinical Pharmacist [facility]

## 2023-02-24 ENCOUNTER — Emergency Department (HOSPITAL_BASED_OUTPATIENT_CLINIC_OR_DEPARTMENT_OTHER)
Admission: EM | Admit: 2023-02-24 | Discharge: 2023-02-24 | Disposition: A | Discharge status: Home / Self Care | Payer: BC Managed Care – PPO | Attending: Emergency Medicine | Admitting: Emergency Medicine

## 2023-02-24 ENCOUNTER — Encounter (HOSPITAL_BASED_OUTPATIENT_CLINIC_OR_DEPARTMENT_OTHER): Payer: Self-pay | Admitting: Emergency Medicine

## 2023-02-24 ENCOUNTER — Other Ambulatory Visit: Payer: Self-pay

## 2023-02-24 ENCOUNTER — Emergency Department (HOSPITAL_BASED_OUTPATIENT_CLINIC_OR_DEPARTMENT_OTHER): Payer: BC Managed Care – PPO | Admitting: Radiology

## 2023-02-24 DIAGNOSIS — M25562 Pain in left knee: Secondary | ICD-10-CM | POA: Insufficient documentation

## 2023-02-24 DIAGNOSIS — M25552 Pain in left hip: Secondary | ICD-10-CM | POA: Diagnosis not present

## 2023-02-24 DIAGNOSIS — M19012 Primary osteoarthritis, left shoulder: Secondary | ICD-10-CM | POA: Diagnosis not present

## 2023-02-24 DIAGNOSIS — M25512 Pain in left shoulder: Secondary | ICD-10-CM | POA: Insufficient documentation

## 2023-02-24 NOTE — ED Triage Notes (Signed)
Pt c/o BUE pain x 1 month. Also c/o LT knee pain. Denies recent injury, denies CP or shob.

## 2023-02-24 NOTE — ED Provider Notes (Addendum)
[place] EMERGENCY DEPARTMENT AT [facility] Provider Note   CSN: [id number] Arrival date & time: 02/24/23  7829     History  Chief Complaint  Patient presents with   Shoulder Pain   Knee Pain    Joshua Blake. is a 52 y.o. male.  Patient with new complaint of left knee pain left shoulder pain.  Patient was seen by [facility] care here at [facility] on March 15 with a complaint of left hip pain and right shoulder pain.  They injected best I can tell from the notes his right shoulder as well as his left hip.  They were thinking that he may have had rotator cuff impingement syndrome and arthralgia of the right AC joint.  Patient states that the injections helped.  But now he has pain in both shoulders new to the left shoulder and still has recurrent pain in the left hip and now has pain in the left knee.  Denies any numbness or weakness distally.  Denies any injuries.  Patient has been taking 1 extra strength Tylenol for the pain.  Patient has been resistant to does not work.  The orthopedic visit on the 15th made mention about follow-up in 3 weeks and some consideration may be for MRI.       Home Medications Prior to Admission medications   Medication Sig Start Date End Date Taking? Authorizing Provider  amitriptyline (ELAVIL) 25 MG tablet Take 1/2 pill at bedtime for one week, then increase to 1 pill at bedtime Patient not taking: Reported on 07/07/2022 10/07/21   Ocie Doyne, MD  amLODipine (NORVASC) 5 MG tablet Take 1 tablet (5 mg total) by mouth daily. 02/24/21 07/07/22  Orion Crook I, NP  buPROPion (WELLBUTRIN SR) 150 MG 12 hr tablet TAKE 1 TABLET TWICE A DAY Patient not taking: Reported on 07/07/2022 09/07/21   Orion Crook I, NP  cyclobenzaprine (FLEXERIL) 5 MG tablet Take 1 tablet (5 mg total) by mouth 3 (three) times daily as needed for muscle spasms. Patient not taking: Reported on 01/27/2023 07/07/22   Donell Beers, FNP   lisinopril-hydrochlorothiazide (ZESTORETIC) 10-12.5 MG tablet TAKE 1 TABLET DAILY Patient not taking: Reported on 01/27/2023 09/07/21   Orion Crook I, NP  meloxicam (MOBIC) 7.5 MG tablet Take 1 tablet (7.5 mg total) by mouth daily. Patient not taking: Reported on 01/27/2023 07/07/22   Donell Beers, FNP      Allergies    Patient has no known allergies.    Review of Systems   Review of Systems  Constitutional:  Negative for chills and fever.  HENT:  Negative for ear pain and sore throat.   Eyes:  Negative for pain and visual disturbance.  Respiratory:  Negative for cough and shortness of breath.   Cardiovascular:  Negative for chest pain and palpitations.  Gastrointestinal:  Negative for abdominal pain and vomiting.  Genitourinary:  Negative for dysuria and hematuria.  Musculoskeletal:  Positive for arthralgias. Negative for back pain and neck pain.  Skin:  Negative for color change and rash.  Neurological:  Negative for seizures, syncope, weakness and numbness.  All other systems reviewed and are negative.   Physical Exam Updated Vital Signs BP (!) 152/107   Pulse 66   Temp 98.2 F (36.8 C) (Oral)   Resp 18   SpO2 100%  Physical Exam Vitals and nursing note reviewed.  Constitutional:      General: He is not in acute distress.    Appearance: Normal  appearance. He is well-developed.  HENT:     Head: Normocephalic and atraumatic.  Eyes:     Extraocular Movements: Extraocular movements intact.     Conjunctiva/sclera: Conjunctivae normal.     Pupils: Pupils are equal, round, and reactive to light.  Cardiovascular:     Rate and Rhythm: Normal rate and regular rhythm.     Heart sounds: No murmur heard. Pulmonary:     Effort: Pulmonary effort is normal. No respiratory distress.     Breath sounds: Normal breath sounds.  Abdominal:     Palpations: Abdomen is soft.     Tenderness: There is no abdominal tenderness.  Musculoskeletal:        General: No swelling,  tenderness, deformity or signs of injury.     Cervical back: Normal range of motion and neck supple. No rigidity.     Comments: Patient's left shoulder with good range of motion.  Patient able to put his hand on top of his head.  Radial pulses 2+ sensation to fingers intact.  Patient does have pain with movement of that left shoulder.  Patient also with pain to the left knee.  But no evidence of any swelling.  Patella is in place.  Distally neurovascularly intact.  Patient with persistent pain to the left hip but able to move that.  And also has the persistent pain in the right shoulder.  Radial pulse 2+ distally neurovascularly intact distally good range of motion of the right shoulder.  Skin:    General: Skin is warm and dry.     Capillary Refill: Capillary refill takes less than 2 seconds.  Neurological:     General: No focal deficit present.     Mental Status: He is alert and oriented to person, place, and time.     Sensory: No sensory deficit.     Motor: No weakness.  Psychiatric:        Mood and Affect: Mood normal.     ED Results / Procedures / Treatments   Labs (all labs ordered are listed, but only abnormal results are displayed) Labs Reviewed - No data to display  EKG None  Radiology No results found.  Procedures Procedures    Medications Ordered in ED Medications - No data to display  ED Course/ Medical Decision Making/ A&P                                 Medical Decision Making Amount and/or Complexity of Data Reviewed Radiology: ordered.  Patient already evaluated by orthopedics regarding the left hip and right shoulder.  New complaint now of left knee left shoulder with so we will get x-rays of that.  Patient needs to follow back up with orthopedics at [facility] here at the [facility].  Patient resistant to work note.  Patient not able to handle medicines like Mobic or Advil.  2 tablets of extra strength Tylenol would be an option.  Patient also somewhat  resistant to hydrocodone but his spouse is going to talk to him about that.  Clearly patient could not work while taking the hydrocodone.  Trays of both the left shoulder and the left knee without evidence of any acute fractures or dislocation.  There is evidence of both that could represent remote injuries.  Still recommend follow-up with orthopedics.  Final Clinical Impression(s) / ED Diagnoses Final diagnoses:  Acute pain of left knee  Acute pain of left shoulder  Rx / DC Orders ED Discharge Orders     None         Vanetta Mulders, MD 02/24/23 [id number]    Vanetta Mulders, MD 02/24/23 [phone]

## 2023-02-24 NOTE — Discharge Instructions (Addendum)
Contact [facility] here [facility] for follow-up.  Information provided above.  Light duty note provided for today and tomorrow.  Also recommend taking extra strength Tylenol 2 tablets every 8 hours.

## 2023-02-25 ENCOUNTER — Encounter (HOSPITAL_BASED_OUTPATIENT_CLINIC_OR_DEPARTMENT_OTHER): Payer: Self-pay | Admitting: Student

## 2023-02-25 ENCOUNTER — Ambulatory Visit (HOSPITAL_BASED_OUTPATIENT_CLINIC_OR_DEPARTMENT_OTHER): Payer: BC Managed Care – PPO | Admitting: Student

## 2023-02-25 DIAGNOSIS — M25512 Pain in left shoulder: Secondary | ICD-10-CM | POA: Diagnosis not present

## 2023-02-25 DIAGNOSIS — M25562 Pain in left knee: Secondary | ICD-10-CM | POA: Diagnosis not present

## 2023-02-25 MED ORDER — TRIAMCINOLONE ACETONIDE 40 MG/ML IJ SUSP
2.0000 mL | INTRAMUSCULAR | Status: AC | PRN
  Administered 2023-02-25: 2 mL via INTRA_ARTICULAR

## 2023-02-25 MED ORDER — LIDOCAINE HCL 1 % IJ SOLN
4.0000 mL | INTRAMUSCULAR | Status: AC | PRN
  Administered 2023-02-25: 4 mL
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
## 2023-05-26 ENCOUNTER — Other Ambulatory Visit (HOSPITAL_COMMUNITY): Payer: Self-pay

## 2023-05-26 DIAGNOSIS — F17210 Nicotine dependence, cigarettes, uncomplicated: Secondary | ICD-10-CM | POA: Diagnosis not present

## 2023-05-26 DIAGNOSIS — F10239 Alcohol dependence with withdrawal, unspecified: Secondary | ICD-10-CM | POA: Diagnosis not present

## 2023-05-26 DIAGNOSIS — Z91148 Patient's other noncompliance with medication regimen for other reason: Secondary | ICD-10-CM | POA: Diagnosis not present

## 2023-05-26 DIAGNOSIS — I10 Essential (primary) hypertension: Secondary | ICD-10-CM | POA: Diagnosis not present

## 2023-05-26 DIAGNOSIS — R001 Bradycardia, unspecified: Secondary | ICD-10-CM | POA: Diagnosis not present

## 2023-05-26 DIAGNOSIS — Z79899 Other long term (current) drug therapy: Secondary | ICD-10-CM | POA: Diagnosis not present

## 2023-05-26 DIAGNOSIS — F1020 Alcohol dependence, uncomplicated: Secondary | ICD-10-CM | POA: Diagnosis not present

## 2023-05-27 DIAGNOSIS — F1020 Alcohol dependence, uncomplicated: Secondary | ICD-10-CM | POA: Diagnosis not present

## 2023-05-27 DIAGNOSIS — F17210 Nicotine dependence, cigarettes, uncomplicated: Secondary | ICD-10-CM | POA: Diagnosis not present

## 2023-05-28 DIAGNOSIS — F1020 Alcohol dependence, uncomplicated: Secondary | ICD-10-CM | POA: Diagnosis not present

## 2023-05-29 DIAGNOSIS — F1020 Alcohol dependence, uncomplicated: Secondary | ICD-10-CM | POA: Diagnosis not present

## 2023-05-30 DIAGNOSIS — F1020 Alcohol dependence, uncomplicated: Secondary | ICD-10-CM | POA: Diagnosis not present

## 2023-05-30 DIAGNOSIS — I10 Essential (primary) hypertension: Secondary | ICD-10-CM | POA: Diagnosis not present

## 2023-05-31 DIAGNOSIS — F1020 Alcohol dependence, uncomplicated: Secondary | ICD-10-CM | POA: Diagnosis not present

## 2023-05-31 DIAGNOSIS — I10 Essential (primary) hypertension: Secondary | ICD-10-CM | POA: Diagnosis not present

## 2023-06-01 DIAGNOSIS — F1020 Alcohol dependence, uncomplicated: Secondary | ICD-10-CM | POA: Diagnosis not present

## 2023-06-02 DIAGNOSIS — Z79899 Other long term (current) drug therapy: Secondary | ICD-10-CM | POA: Diagnosis not present

## 2023-06-02 DIAGNOSIS — F1020 Alcohol dependence, uncomplicated: Secondary | ICD-10-CM | POA: Diagnosis not present

## 2023-06-03 DIAGNOSIS — F1020 Alcohol dependence, uncomplicated: Secondary | ICD-10-CM | POA: Diagnosis not present

## 2023-06-04 DIAGNOSIS — F1020 Alcohol dependence, uncomplicated: Secondary | ICD-10-CM | POA: Diagnosis not present

## 2023-06-06 DIAGNOSIS — Z79899 Other long term (current) drug therapy: Secondary | ICD-10-CM | POA: Diagnosis not present

## 2023-06-06 DIAGNOSIS — F1020 Alcohol dependence, uncomplicated: Secondary | ICD-10-CM | POA: Diagnosis not present

## 2023-06-07 DIAGNOSIS — F1020 Alcohol dependence, uncomplicated: Secondary | ICD-10-CM | POA: Diagnosis not present

## 2023-06-08 DIAGNOSIS — F1020 Alcohol dependence, uncomplicated: Secondary | ICD-10-CM | POA: Diagnosis not present

## 2023-06-08 DIAGNOSIS — Z79899 Other long term (current) drug therapy: Secondary | ICD-10-CM | POA: Diagnosis not present

## 2023-06-09 DIAGNOSIS — F1020 Alcohol dependence, uncomplicated: Secondary | ICD-10-CM | POA: Diagnosis not present

## 2023-06-10 DIAGNOSIS — F1020 Alcohol dependence, uncomplicated: Secondary | ICD-10-CM | POA: Diagnosis not present

## 2023-06-11 DIAGNOSIS — F1020 Alcohol dependence, uncomplicated: Secondary | ICD-10-CM | POA: Diagnosis not present

## 2023-06-13 DIAGNOSIS — F1020 Alcohol dependence, uncomplicated: Secondary | ICD-10-CM | POA: Diagnosis not present

## 2023-06-14 DIAGNOSIS — Z79899 Other long term (current) drug therapy: Secondary | ICD-10-CM | POA: Diagnosis not present

## 2023-06-14 DIAGNOSIS — F1020 Alcohol dependence, uncomplicated: Secondary | ICD-10-CM | POA: Diagnosis not present

## 2023-06-15 DIAGNOSIS — F1020 Alcohol dependence, uncomplicated: Secondary | ICD-10-CM | POA: Diagnosis not present

## 2023-06-16 DIAGNOSIS — Z79899 Other long term (current) drug therapy: Secondary | ICD-10-CM | POA: Diagnosis not present

## 2023-06-16 DIAGNOSIS — F1020 Alcohol dependence, uncomplicated: Secondary | ICD-10-CM | POA: Diagnosis not present

## 2023-06-17 DIAGNOSIS — F1020 Alcohol dependence, uncomplicated: Secondary | ICD-10-CM | POA: Diagnosis not present

## 2023-06-18 DIAGNOSIS — F1020 Alcohol dependence, uncomplicated: Secondary | ICD-10-CM | POA: Diagnosis not present

## 2023-06-20 DIAGNOSIS — F5101 Primary insomnia: Secondary | ICD-10-CM | POA: Diagnosis not present

## 2023-06-20 DIAGNOSIS — F1020 Alcohol dependence, uncomplicated: Secondary | ICD-10-CM | POA: Diagnosis not present

## 2023-06-21 DIAGNOSIS — F1020 Alcohol dependence, uncomplicated: Secondary | ICD-10-CM | POA: Diagnosis not present

## 2023-06-22 DIAGNOSIS — F1020 Alcohol dependence, uncomplicated: Secondary | ICD-10-CM | POA: Diagnosis not present

## 2023-06-23 DIAGNOSIS — F1020 Alcohol dependence, uncomplicated: Secondary | ICD-10-CM | POA: Diagnosis not present

## 2023-06-24 DIAGNOSIS — F1020 Alcohol dependence, uncomplicated: Secondary | ICD-10-CM | POA: Diagnosis not present

## 2023-06-25 DIAGNOSIS — F1020 Alcohol dependence, uncomplicated: Secondary | ICD-10-CM | POA: Diagnosis not present

## 2023-06-27 DIAGNOSIS — F1020 Alcohol dependence, uncomplicated: Secondary | ICD-10-CM | POA: Diagnosis not present

## 2023-06-28 DIAGNOSIS — F1020 Alcohol dependence, uncomplicated: Secondary | ICD-10-CM | POA: Diagnosis not present

## 2023-06-28 DIAGNOSIS — F5101 Primary insomnia: Secondary | ICD-10-CM | POA: Diagnosis not present

## 2023-06-29 DIAGNOSIS — F1020 Alcohol dependence, uncomplicated: Secondary | ICD-10-CM | POA: Diagnosis not present

## 2023-06-30 DIAGNOSIS — F1020 Alcohol dependence, uncomplicated: Secondary | ICD-10-CM | POA: Diagnosis not present

## 2023-06-30 DIAGNOSIS — Z79899 Other long term (current) drug therapy: Secondary | ICD-10-CM | POA: Diagnosis not present

## 2023-07-07 DIAGNOSIS — F1020 Alcohol dependence, uncomplicated: Secondary | ICD-10-CM | POA: Diagnosis not present

## 2023-07-11 DIAGNOSIS — F1020 Alcohol dependence, uncomplicated: Secondary | ICD-10-CM | POA: Diagnosis not present

## 2023-07-14 DIAGNOSIS — Z1330 Encounter for screening examination for mental health and behavioral disorders, unspecified: Secondary | ICD-10-CM | POA: Diagnosis not present

## 2023-07-14 DIAGNOSIS — M25562 Pain in left knee: Secondary | ICD-10-CM | POA: Diagnosis not present

## 2023-07-14 DIAGNOSIS — M25552 Pain in left hip: Secondary | ICD-10-CM | POA: Diagnosis not present

## 2023-07-14 DIAGNOSIS — R109 Unspecified abdominal pain: Secondary | ICD-10-CM | POA: Diagnosis not present

## 2023-07-14 DIAGNOSIS — Z760 Encounter for issue of repeat prescription: Secondary | ICD-10-CM | POA: Diagnosis not present

## 2023-07-18 DIAGNOSIS — F1020 Alcohol dependence, uncomplicated: Secondary | ICD-10-CM | POA: Diagnosis not present

## 2023-07-19 DIAGNOSIS — F1020 Alcohol dependence, uncomplicated: Secondary | ICD-10-CM | POA: Diagnosis not present

## 2023-07-21 DIAGNOSIS — F1020 Alcohol dependence, uncomplicated: Secondary | ICD-10-CM | POA: Diagnosis not present

## 2023-07-25 DIAGNOSIS — F1020 Alcohol dependence, uncomplicated: Secondary | ICD-10-CM | POA: Diagnosis not present

## 2023-07-26 DIAGNOSIS — F1020 Alcohol dependence, uncomplicated: Secondary | ICD-10-CM | POA: Diagnosis not present

## 2023-07-28 DIAGNOSIS — F1020 Alcohol dependence, uncomplicated: Secondary | ICD-10-CM | POA: Diagnosis not present

## 2023-08-01 DIAGNOSIS — F1020 Alcohol dependence, uncomplicated: Secondary | ICD-10-CM | POA: Diagnosis not present

## 2023-08-02 DIAGNOSIS — F1020 Alcohol dependence, uncomplicated: Secondary | ICD-10-CM | POA: Diagnosis not present

## 2023-08-04 DIAGNOSIS — F1020 Alcohol dependence, uncomplicated: Secondary | ICD-10-CM | POA: Diagnosis not present

## 2023-08-08 DIAGNOSIS — F1020 Alcohol dependence, uncomplicated: Secondary | ICD-10-CM | POA: Diagnosis not present

## 2023-08-09 DIAGNOSIS — Z0000 Encounter for general adult medical examination without abnormal findings: Secondary | ICD-10-CM | POA: Diagnosis not present

## 2023-08-09 DIAGNOSIS — M25552 Pain in left hip: Secondary | ICD-10-CM | POA: Diagnosis not present

## 2023-08-09 DIAGNOSIS — I10 Essential (primary) hypertension: Secondary | ICD-10-CM | POA: Diagnosis not present

## 2023-08-09 DIAGNOSIS — R739 Hyperglycemia, unspecified: Secondary | ICD-10-CM | POA: Diagnosis not present

## 2023-08-09 DIAGNOSIS — R7989 Other specified abnormal findings of blood chemistry: Secondary | ICD-10-CM | POA: Diagnosis not present

## 2023-08-09 DIAGNOSIS — F1020 Alcohol dependence, uncomplicated: Secondary | ICD-10-CM | POA: Diagnosis not present

## 2023-08-09 DIAGNOSIS — H6122 Impacted cerumen, left ear: Secondary | ICD-10-CM | POA: Diagnosis not present

## 2023-08-10 DIAGNOSIS — R194 Change in bowel habit: Secondary | ICD-10-CM | POA: Diagnosis not present

## 2023-08-11 DIAGNOSIS — F1020 Alcohol dependence, uncomplicated: Secondary | ICD-10-CM | POA: Diagnosis not present

## 2023-08-15 DIAGNOSIS — F1020 Alcohol dependence, uncomplicated: Secondary | ICD-10-CM | POA: Diagnosis not present

## 2023-08-16 DIAGNOSIS — F1020 Alcohol dependence, uncomplicated: Secondary | ICD-10-CM | POA: Diagnosis not present

## 2023-08-17 ENCOUNTER — Emergency Department (HOSPITAL_COMMUNITY)

## 2023-08-17 ENCOUNTER — Emergency Department (HOSPITAL_COMMUNITY)
Admission: EM | Admit: 2023-08-17 | Discharge: 2023-08-18 | Disposition: A | Discharge status: Home / Self Care | Attending: Emergency Medicine | Admitting: Emergency Medicine

## 2023-08-17 DIAGNOSIS — I10 Essential (primary) hypertension: Secondary | ICD-10-CM | POA: Insufficient documentation

## 2023-08-17 DIAGNOSIS — Z79899 Other long term (current) drug therapy: Secondary | ICD-10-CM | POA: Insufficient documentation

## 2023-08-17 DIAGNOSIS — Z538 Procedure and treatment not carried out for other reasons: Secondary | ICD-10-CM | POA: Diagnosis not present

## 2023-08-17 DIAGNOSIS — R112 Nausea with vomiting, unspecified: Secondary | ICD-10-CM | POA: Diagnosis not present

## 2023-08-17 DIAGNOSIS — K648 Other hemorrhoids: Secondary | ICD-10-CM | POA: Diagnosis not present

## 2023-08-17 DIAGNOSIS — N3289 Other specified disorders of bladder: Secondary | ICD-10-CM | POA: Diagnosis not present

## 2023-08-17 DIAGNOSIS — K644 Residual hemorrhoidal skin tags: Secondary | ICD-10-CM | POA: Diagnosis not present

## 2023-08-17 DIAGNOSIS — R109 Unspecified abdominal pain: Secondary | ICD-10-CM | POA: Diagnosis not present

## 2023-08-17 DIAGNOSIS — R1084 Generalized abdominal pain: Secondary | ICD-10-CM | POA: Diagnosis not present

## 2023-08-17 DIAGNOSIS — R194 Change in bowel habit: Secondary | ICD-10-CM | POA: Diagnosis not present

## 2023-08-17 LAB — CBC WITH DIFFERENTIAL/PLATELET
Abs Immature Granulocytes: 0.05 10*3/uL (ref 0.00–0.07)
Basophils Absolute: 0.1 10*3/uL (ref 0.0–0.1)
Basophils Relative: 1 %
Eosinophils Absolute: 0 10*3/uL (ref 0.0–0.5)
Eosinophils Relative: 0 %
HCT: 39.3 % (ref 39.0–52.0)
Hemoglobin: 13.9 g/dL (ref 13.0–17.0)
Immature Granulocytes: 1 %
Lymphocytes Relative: 9 %
Lymphs Abs: 0.9 10*3/uL (ref 0.7–4.0)
MCH: 32.9 pg (ref 26.0–34.0)
MCHC: 35.4 g/dL (ref 30.0–36.0)
MCV: 93.1 fL (ref 80.0–100.0)
Monocytes Absolute: 0.5 10*3/uL (ref 0.1–1.0)
Monocytes Relative: 4 %
Neutro Abs: 9.2 10*3/uL — ABNORMAL HIGH (ref 1.7–7.7)
Neutrophils Relative %: 85 %
Platelets: 248 10*3/uL (ref 150–400)
RBC: 4.22 MIL/uL (ref 4.22–5.81)
RDW: 11.6 % (ref 11.5–15.5)
Smear Review: NORMAL
WBC: 10.7 10*3/uL — ABNORMAL HIGH (ref 4.0–10.5)
nRBC: 0 % (ref 0.0–0.2)

## 2023-08-17 LAB — COMPREHENSIVE METABOLIC PANEL WITH GFR
ALT: 13 U/L (ref 0–44)
AST: 21 U/L (ref 15–41)
Albumin: 4.3 g/dL (ref 3.5–5.0)
Alkaline Phosphatase: 103 U/L (ref 38–126)
Anion gap: 12 (ref 5–15)
BUN: 11 mg/dL (ref 6–20)
CO2: 24 mmol/L (ref 22–32)
Calcium: 9.6 mg/dL (ref 8.9–10.3)
Chloride: 102 mmol/L (ref 98–111)
Creatinine, Ser: 0.93 mg/dL (ref 0.61–1.24)
GFR, Estimated: >60 mL/min (ref >60)
Glucose, Bld: 148 mg/dL — ABNORMAL HIGH (ref 70–99)
Potassium: 3.2 mmol/L — ABNORMAL LOW (ref 3.5–5.1)
Sodium: 138 mmol/L (ref 135–145)
Total Bilirubin: 1.1 mg/dL (ref 0.0–1.2)
Total Protein: 7.2 g/dL (ref 6.5–8.1)

## 2023-08-17 LAB — I-STAT CG4 LACTIC ACID, ED
Lactic Acid, Venous: 1.2 mmol/L (ref 0.5–1.9)
Lactic Acid, Venous: 3.2 mmol/L (ref 0.5–1.9)

## 2023-08-17 MED ORDER — HALOPERIDOL LACTATE 5 MG/ML IJ SOLN
2.0000 mg | Freq: Once | INTRAMUSCULAR | Status: AC
  Administered 2023-08-17: 2 mg via INTRAVENOUS
  Filled 2023-08-17: qty 1

## 2023-08-17 MED ORDER — ONDANSETRON HCL 4 MG/2ML IJ SOLN
4.0000 mg | Freq: Once | INTRAMUSCULAR | Status: AC
  Administered 2023-08-17: 4 mg via INTRAVENOUS
  Filled 2023-08-17: qty 2

## 2023-08-17 MED ORDER — IOHEXOL 300 MG/ML  SOLN
100.0000 mL | Freq: Once | INTRAMUSCULAR | Status: AC | PRN
  Administered 2023-08-17: 100 mL via INTRAVENOUS

## 2023-08-17 MED ORDER — HYDROMORPHONE HCL 1 MG/ML IJ SOLN
1.0000 mg | Freq: Once | INTRAMUSCULAR | Status: AC
  Administered 2023-08-17: 1 mg via INTRAVENOUS
  Filled 2023-08-17: qty 1

## 2023-08-17 MED ORDER — MORPHINE SULFATE (PF) 4 MG/ML IV SOLN
4.0000 mg | Freq: Once | INTRAVENOUS | Status: AC
  Administered 2023-08-17: 4 mg via INTRAVENOUS
  Filled 2023-08-17: qty 1

## 2023-08-17 MED ORDER — SODIUM CHLORIDE 0.9 % IV BOLUS
1000.0000 mL | Freq: Once | INTRAVENOUS | Status: AC
  Administered 2023-08-17: 1000 mL via INTRAVENOUS

## 2023-08-17 NOTE — ED Provider Notes (Signed)
 [place] [facility] DEPART[facility] AT [facility] Provider Note   CSN: [id number] Arrival date & time: 08/17/23  1914     History {Add pertinent medical, surgical, social history, OB history to HPI:1} No chief complaint on file.   Joshua Blake. is a 53 y.o. male.  With a history of hypertension and alcohol use disorder in remission who presents to ED for abdominal pain.  Patient underwent routine colonoscopy in [place] earlier today.  They were not able to complete the full colonoscopy due to incomplete bowel cleanout with stool in the transverse colon.  He tolerated the procedure well and was discharged home.  But 2 hours after getting home he began to have abdominal pain with nausea and vomiting as well as breaking out to cold sweats.  HPI     Home Medications Prior to Admission medications   Medication Sig Start Date End Date Taking? Authorizing Provider  amitriptyline (ELAVIL) 25 MG tablet Take 1/2 pill at bedtime for one week, then increase to 1 pill at bedtime Patient not taking: Reported on 07/07/2022 10/07/21   Dala Dublin, MD  amLODipine (NORVASC) 5 MG tablet Take 1 tablet (5 mg total) by mouth daily. 02/24/21 07/07/22  Filbert Huff I, NP  buPROPion (WELLBUTRIN SR) 150 MG 12 hr tablet TAKE 1 TABLET TWICE A DAY Patient not taking: Reported on 07/07/2022 09/07/21   Filbert Huff I, NP  cyclobenzaprine (FLEXERIL) 5 MG tablet Take 1 tablet (5 mg total) by mouth 3 (three) times daily as needed for muscle spasms. Patient not taking: Reported on 01/27/2023 07/07/22   Paseda, Folashade R, FNP  lisinopril-hydrochlorothiazide (ZESTORETIC) 10-12.5 MG tablet TAKE 1 TABLET DAILY Patient not taking: Reported on 01/27/2023 09/07/21   Filbert Huff I, NP  meloxicam (MOBIC) 7.5 MG tablet Take 1 tablet (7.5 mg total) by mouth daily. Patient not taking: Reported on 01/27/2023 07/07/22   Paseda, Folashade R, FNP      Allergies    Patient has no known allergies.    Review of  Systems   Review of Systems  Physical Exam Updated Vital Signs BP (!) 157/80 (BP Location: Left Arm)   Pulse 66   Temp 97.9 F (36.6 C) (Oral)   Resp 14   Ht 5\' 11"  (1.803 m)   Wt 83.5 kg   SpO2 100%   BMI 25.66 kg/m  Physical Exam Vitals and nursing note reviewed.  HENT:     Head: Normocephalic and atraumatic.  Eyes:     Pupils: Pupils are equal, round, and reactive to light.  Cardiovascular:     Rate and Rhythm: Normal rate and regular rhythm.  Pulmonary:     Effort: Pulmonary effort is normal.     Breath sounds: Normal breath sounds.  Abdominal:     Palpations: Abdomen is soft.     Tenderness: There is abdominal tenderness.     Comments: Mild diffuse abdominal tenderness without rebound rigidity guarding  Skin:    General: Skin is warm and dry.  Neurological:     Mental Status: He is alert.  Psychiatric:        Mood and Affect: Mood normal.     ED Results / Procedures / Treatments   Labs (all labs ordered are listed, but only abnormal results are displayed) Labs Reviewed  COMPREHENSIVE METABOLIC PANEL WITH GFR  CBC WITH DIFFERENTIAL/PLATELET    EKG None  Radiology No results found.  Procedures Procedures  {Document cardiac monitor, telemetry assessment procedure when appropriate:1}  Medications  Ordered in ED Medications  sodium chloride 0.9 % bolus 1,000 mL (has no administration in time range)    ED Course/ Medical Decision Making/ A&P   {   Click here for ABCD2, HEART and other calculatorsREFRESH Note before signing :1}                              Medical Decision Making 53 year old male with history as above presenting for abdominal pain nausea vomiting acute onset 2 hours after colonoscopy.  Appears uncomfortable on my exam.  Mild diffuse abdominal tenderness.  Afebrile hypertensive no tachycardia.  Differential diagnosis includes perforation, dehydration secondary to colonoscopy prep and reaction to anesthetics.  Will obtain laboratory  workup give IV fluids Zofran morphine for pain and obtain CT abdomen pelvis to look for perforation.  Will reach out to his GI team on-call  Amount and/or Complexity of Data Reviewed Labs: ordered. Radiology: ordered.  Risk Prescription drug management.   ***  {Document critical care time when appropriate:1} {Document review of labs and clinical decision tools ie heart score, Chads2Vasc2 etc:1}  {Document your independent review of radiology images, and any outside records:1} {Document your discussion with family members, caretakers, and with consultants:1} {Document social determinants of health affecting pt's care:1} {Document your decision making why or why not admission, treatments were needed:1} Final Clinical Impression(s) / ED Diagnoses Final diagnoses:  None    Rx / DC Orders ED Discharge Orders     None

## 2023-08-17 NOTE — ED Triage Notes (Signed)
 BIBA from home- Pt had a colonoscopy this afternoon, began having n/v and abdominal cramping after getting home. Pt is cool and clammy. 4 mg zofran given PTA.  140/100 lying,130/80 sitting 100% 88 HR

## 2023-08-17 NOTE — ED Notes (Signed)
 I stat Lactic acid is a 3.15 EDP notified

## 2023-08-18 MED ORDER — ONDANSETRON 4 MG PO TBDP: 4.0000 mg | ORAL_TABLET | Freq: Three times a day (TID) | ORAL | 0 refills | Status: AC | PRN

## 2023-08-18 NOTE — ED Provider Notes (Signed)
 I assumed care of this patient from previous provider.  Please see their note for further details of history, exam, and MDM.   Briefly patient is a 53 y.o. male who presented abdominal pain with nausea and vomiting following colonoscopy earlier in the day.  A workup was reassuring without any intra-abdominal complication from the procedure or inflammatory/infectious process.  Patient was treated symptomatically.  Plan to reassess.  1:47 AM Patient reports significant improvement following Haldol.  Has been tolerating ice chips.  P.o. challenging with oral fluids.  If tolerates, will DC home with ODT Zofran.   The patient appears reasonably screened and/or stabilized for discharge and I doubt any other medical condition or other [facility] requiring further screening, evaluation, or treatment in the ED at this time. I have discussed the findings, Dx and Tx plan with the patient/family who expressed understanding and agree(s) with the plan. Discharge instructions discussed at length. The patient/family was given strict return precautions who verbalized understanding of the instructions. No further questions at time of discharge.  Disposition: Discharge  Condition: Good  ED Discharge Orders          Ordered    ondansetron (ZOFRAN-ODT) 4 MG disintegrating tablet  Every 8 hours PRN        08/18/23 0148              Follow Up: Jerrlyn Morel, NP [address] [place]  [id number] [phone]  Call  to schedule an appointment for close follow up       Valene Villa, Camila Cecil, MD 08/18/23 [phone]

## 2023-08-22 DIAGNOSIS — F1020 Alcohol dependence, uncomplicated: Secondary | ICD-10-CM | POA: Diagnosis not present

## 2023-08-23 DIAGNOSIS — F1020 Alcohol dependence, uncomplicated: Secondary | ICD-10-CM | POA: Diagnosis not present

## 2023-08-25 DIAGNOSIS — F1020 Alcohol dependence, uncomplicated: Secondary | ICD-10-CM | POA: Diagnosis not present

## 2023-08-29 DIAGNOSIS — F1020 Alcohol dependence, uncomplicated: Secondary | ICD-10-CM | POA: Diagnosis not present

## 2023-08-30 DIAGNOSIS — F1020 Alcohol dependence, uncomplicated: Secondary | ICD-10-CM | POA: Diagnosis not present

## 2023-09-12 ENCOUNTER — Other Ambulatory Visit (HOSPITAL_BASED_OUTPATIENT_CLINIC_OR_DEPARTMENT_OTHER): Payer: Self-pay | Admitting: *Deleted

## 2023-09-12 DIAGNOSIS — R1084 Generalized abdominal pain: Secondary | ICD-10-CM | POA: Diagnosis not present

## 2023-09-14 ENCOUNTER — Ambulatory Visit (HOSPITAL_BASED_OUTPATIENT_CLINIC_OR_DEPARTMENT_OTHER)
Admission: RE | Admit: 2023-09-14 | Discharge: 2023-09-14 | Disposition: A | Discharge status: Home / Self Care | Source: Ambulatory Visit | Attending: *Deleted | Admitting: *Deleted

## 2023-09-14 DIAGNOSIS — R1084 Generalized abdominal pain: Secondary | ICD-10-CM | POA: Insufficient documentation

## 2023-09-29 DIAGNOSIS — G479 Sleep disorder, unspecified: Secondary | ICD-10-CM | POA: Diagnosis not present

## 2023-09-29 DIAGNOSIS — I10 Essential (primary) hypertension: Secondary | ICD-10-CM | POA: Diagnosis not present

## 2023-09-29 DIAGNOSIS — F1020 Alcohol dependence, uncomplicated: Secondary | ICD-10-CM | POA: Diagnosis not present

## 2023-09-29 DIAGNOSIS — F419 Anxiety disorder, unspecified: Secondary | ICD-10-CM | POA: Diagnosis not present

## 2023-10-19 DIAGNOSIS — F17210 Nicotine dependence, cigarettes, uncomplicated: Secondary | ICD-10-CM | POA: Diagnosis not present

## 2023-11-24 DIAGNOSIS — I10 Essential (primary) hypertension: Secondary | ICD-10-CM | POA: Diagnosis not present

## 2023-11-24 DIAGNOSIS — R42 Dizziness and giddiness: Secondary | ICD-10-CM | POA: Diagnosis not present

## 2023-11-24 DIAGNOSIS — M533 Sacrococcygeal disorders, not elsewhere classified: Secondary | ICD-10-CM | POA: Diagnosis not present

## 2023-11-24 DIAGNOSIS — N528 Other male erectile dysfunction: Secondary | ICD-10-CM | POA: Diagnosis not present

## 2023-11-24 DIAGNOSIS — H6123 Impacted cerumen, bilateral: Secondary | ICD-10-CM | POA: Diagnosis not present

## 2023-11-24 DIAGNOSIS — H538 Other visual disturbances: Secondary | ICD-10-CM | POA: Diagnosis not present

## 2023-12-24 DIAGNOSIS — M546 Pain in thoracic spine: Secondary | ICD-10-CM | POA: Diagnosis not present

## 2023-12-24 DIAGNOSIS — M25552 Pain in left hip: Secondary | ICD-10-CM | POA: Diagnosis not present

## 2023-12-24 DIAGNOSIS — M549 Dorsalgia, unspecified: Secondary | ICD-10-CM | POA: Diagnosis not present

## 2023-12-28 DIAGNOSIS — M461 Sacroiliitis, not elsewhere classified: Secondary | ICD-10-CM | POA: Diagnosis not present

## 2024-01-04 DIAGNOSIS — M461 Sacroiliitis, not elsewhere classified: Secondary | ICD-10-CM | POA: Diagnosis not present

## 2024-01-09 DIAGNOSIS — M461 Sacroiliitis, not elsewhere classified: Secondary | ICD-10-CM | POA: Diagnosis not present

## 2024-01-19 DIAGNOSIS — I10 Essential (primary) hypertension: Secondary | ICD-10-CM | POA: Diagnosis not present

## 2024-01-19 DIAGNOSIS — R270 Ataxia, unspecified: Secondary | ICD-10-CM | POA: Diagnosis not present

## 2024-01-25 DIAGNOSIS — R42 Dizziness and giddiness: Secondary | ICD-10-CM | POA: Diagnosis not present

## 2024-01-31 DIAGNOSIS — M461 Sacroiliitis, not elsewhere classified: Secondary | ICD-10-CM | POA: Diagnosis not present

## 2024-02-02 DIAGNOSIS — M461 Sacroiliitis, not elsewhere classified: Secondary | ICD-10-CM | POA: Diagnosis not present

## 2024-02-13 DIAGNOSIS — R9082 White matter disease, unspecified: Secondary | ICD-10-CM | POA: Diagnosis not present

## 2024-02-13 DIAGNOSIS — Z8673 Personal history of transient ischemic attack (TIA), and cerebral infarction without residual deficits: Secondary | ICD-10-CM | POA: Diagnosis not present
